# Patient Record
Sex: Male | Born: 1985 | Race: Black or African American | Hispanic: No | Smoking: Never smoker
Health system: Southern US, Community
[De-identification: ages and names within clinical notes are randomized; demographics above are authoritative.]

## PROBLEM LIST (undated history)

## (undated) DIAGNOSIS — K649 Unspecified hemorrhoids: Secondary | ICD-10-CM

## (undated) DIAGNOSIS — K219 Gastro-esophageal reflux disease without esophagitis: Secondary | ICD-10-CM

## (undated) DIAGNOSIS — E876 Hypokalemia: Secondary | ICD-10-CM

## (undated) HISTORY — PX: HERNIA REPAIR: SHX51

## (undated) HISTORY — DX: Gastro-esophageal reflux disease without esophagitis: K21.9

---

## 2008-07-27 ENCOUNTER — Emergency Department (HOSPITAL_COMMUNITY): Admission: EM | Admit: 2008-07-27 | Discharge: 2008-07-27 | Payer: Self-pay | Admitting: Emergency Medicine

## 2008-08-03 ENCOUNTER — Emergency Department (HOSPITAL_COMMUNITY): Admission: EM | Admit: 2008-08-03 | Discharge: 2008-08-03 | Payer: Self-pay | Admitting: Family Medicine

## 2009-01-23 ENCOUNTER — Emergency Department (HOSPITAL_COMMUNITY): Admission: EM | Admit: 2009-01-23 | Discharge: 2009-01-23 | Payer: Self-pay | Admitting: Emergency Medicine

## 2010-09-03 ENCOUNTER — Emergency Department (HOSPITAL_COMMUNITY)
Admission: EM | Admit: 2010-09-03 | Discharge: 2010-09-03 | Payer: Self-pay | Source: Home / Self Care | Admitting: Emergency Medicine

## 2010-11-06 ENCOUNTER — Emergency Department (HOSPITAL_COMMUNITY)
Admission: EM | Admit: 2010-11-06 | Discharge: 2010-11-06 | Payer: Self-pay | Source: Home / Self Care | Admitting: Emergency Medicine

## 2010-11-09 ENCOUNTER — Inpatient Hospital Stay (HOSPITAL_COMMUNITY)
Admission: EM | Admit: 2010-11-09 | Discharge: 2010-11-11 | Payer: Self-pay | Source: Home / Self Care | Attending: Internal Medicine | Admitting: Internal Medicine

## 2011-01-21 LAB — BASIC METABOLIC PANEL
BUN: 13 mg/dL (ref 6–23)
CO2: 27 mEq/L (ref 19–32)
Calcium: 9.6 mg/dL (ref 8.4–10.5)
Chloride: 103 mEq/L (ref 96–112)
Creatinine, Ser: 1.02 mg/dL (ref 0.4–1.5)
GFR calc Af Amer: >60 mL/min (ref >60)
GFR calc non Af Amer: >60 mL/min (ref >60)
Glucose, Bld: 130 mg/dL — ABNORMAL HIGH (ref 70–99)
Potassium: 4.1 mEq/L (ref 3.5–5.1)
Sodium: 139 mEq/L (ref 135–145)

## 2011-01-21 LAB — CBC
HCT: 42.9 % (ref 39.0–52.0)
HCT: 44.8 % (ref 39.0–52.0)
Hemoglobin: 14.4 g/dL (ref 13.0–17.0)
Hemoglobin: 15.1 g/dL (ref 13.0–17.0)
MCH: 30.6 pg (ref 26.0–34.0)
MCH: 30.8 pg (ref 26.0–34.0)
MCHC: 33.6 g/dL (ref 30.0–36.0)
MCHC: 33.7 g/dL (ref 30.0–36.0)
MCV: 91.1 fL (ref 78.0–100.0)
MCV: 91.2 fL (ref 78.0–100.0)
Platelets: 150 10*3/uL (ref 150–400)
Platelets: 172 10*3/uL (ref 150–400)
RBC: 4.71 MIL/uL (ref 4.22–5.81)
RBC: 4.91 MIL/uL (ref 4.22–5.81)
RDW: 12.8 % (ref 11.5–15.5)
RDW: 12.9 % (ref 11.5–15.5)
WBC: 6.2 10*3/uL (ref 4.0–10.5)
WBC: 6.2 10*3/uL (ref 4.0–10.5)

## 2011-01-21 LAB — COMPREHENSIVE METABOLIC PANEL
ALT: 17 U/L (ref 0–53)
AST: 23 U/L (ref 0–37)
Albumin: 3.8 g/dL (ref 3.5–5.2)
Alkaline Phosphatase: 58 U/L (ref 39–117)
BUN: 12 mg/dL (ref 6–23)
CO2: 26 mEq/L (ref 19–32)
Calcium: 9.4 mg/dL (ref 8.4–10.5)
Chloride: 103 mEq/L (ref 96–112)
Creatinine, Ser: 0.93 mg/dL (ref 0.4–1.5)
GFR calc Af Amer: >60 mL/min (ref >60)
GFR calc non Af Amer: >60 mL/min (ref >60)
Glucose, Bld: 129 mg/dL — ABNORMAL HIGH (ref 70–99)
Potassium: 3.9 mEq/L (ref 3.5–5.1)
Sodium: 138 mEq/L (ref 135–145)
Total Bilirubin: 0.6 mg/dL (ref 0.3–1.2)
Total Protein: 7.8 g/dL (ref 6.0–8.3)

## 2011-01-21 LAB — CULTURE, BLOOD (ROUTINE X 2)
Culture  Setup Time: 201112310430
Culture  Setup Time: 201112310430
Culture: NO GROWTH
Culture: NO GROWTH

## 2011-01-21 LAB — DIFFERENTIAL
Basophils Absolute: 0 10*3/uL (ref 0.0–0.1)
Basophils Relative: 0 % (ref 0–1)
Eosinophils Absolute: 0 10*3/uL (ref 0.0–0.7)
Eosinophils Relative: 0 % (ref 0–5)
Lymphocytes Relative: 15 % (ref 12–46)
Lymphs Abs: 0.9 10*3/uL (ref 0.7–4.0)
Monocytes Absolute: 0.2 10*3/uL (ref 0.1–1.0)
Monocytes Relative: 4 % (ref 3–12)
Neutro Abs: 5.1 10*3/uL (ref 1.7–7.7)
Neutrophils Relative %: 81 % — ABNORMAL HIGH (ref 43–77)

## 2011-01-21 LAB — D-DIMER, QUANTITATIVE: D-Dimer, Quant: <0.22 ug/mL-FEU (ref 0.00–0.48)

## 2011-01-23 LAB — COMPREHENSIVE METABOLIC PANEL
ALT: 20 U/L (ref 0–53)
AST: 23 U/L (ref 0–37)
Albumin: 3.9 g/dL (ref 3.5–5.2)
Alkaline Phosphatase: 57 U/L (ref 39–117)
BUN: 9 mg/dL (ref 6–23)
CO2: 32 mEq/L (ref 19–32)
Calcium: 9.2 mg/dL (ref 8.4–10.5)
Chloride: 103 mEq/L (ref 96–112)
Creatinine, Ser: 0.9 mg/dL (ref 0.4–1.5)
GFR calc Af Amer: >60 mL/min (ref >60)
GFR calc non Af Amer: >60 mL/min (ref >60)
Glucose, Bld: 112 mg/dL — ABNORMAL HIGH (ref 70–99)
Potassium: 4 mEq/L (ref 3.5–5.1)
Sodium: 139 mEq/L (ref 135–145)
Total Bilirubin: 0.6 mg/dL (ref 0.3–1.2)
Total Protein: 7.4 g/dL (ref 6.0–8.3)

## 2011-01-23 LAB — CBC
HCT: 43.8 % (ref 39.0–52.0)
Hemoglobin: 14.9 g/dL (ref 13.0–17.0)
MCH: 30.9 pg (ref 26.0–34.0)
MCHC: 34 g/dL (ref 30.0–36.0)
MCV: 90.8 fL (ref 78.0–100.0)
Platelets: 172 10*3/uL (ref 150–400)
RBC: 4.82 MIL/uL (ref 4.22–5.81)
RDW: 13.2 % (ref 11.5–15.5)
WBC: 6.5 10*3/uL (ref 4.0–10.5)

## 2011-01-23 LAB — SEDIMENTATION RATE: Sed Rate: 3 mm/hr (ref 0–16)

## 2011-01-23 LAB — HEMOCCULT GUIAC POC 1CARD (OFFICE): Fecal Occult Bld: POSITIVE

## 2011-02-21 LAB — DIFFERENTIAL
Basophils Absolute: 0 10*3/uL (ref 0.0–0.1)
Basophils Relative: 0 % (ref 0–1)
Eosinophils Absolute: 0.1 10*3/uL (ref 0.0–0.7)
Eosinophils Relative: 1 % (ref 0–5)
Lymphocytes Relative: 11 % — ABNORMAL LOW (ref 12–46)
Lymphs Abs: 0.7 10*3/uL (ref 0.7–4.0)
Monocytes Absolute: 1.1 10*3/uL — ABNORMAL HIGH (ref 0.1–1.0)
Monocytes Relative: 16 % — ABNORMAL HIGH (ref 3–12)
Neutro Abs: 4.7 10*3/uL (ref 1.7–7.7)
Neutrophils Relative %: 72 % (ref 43–77)

## 2011-02-21 LAB — URINALYSIS, ROUTINE W REFLEX MICROSCOPIC
Glucose, UA: NEGATIVE mg/dL
Hgb urine dipstick: NEGATIVE
Ketones, ur: >80 mg/dL — AB
Nitrite: NEGATIVE
Protein, ur: 30 mg/dL — AB
Specific Gravity, Urine: 1.038 — ABNORMAL HIGH (ref 1.005–1.030)
Urobilinogen, UA: 4 mg/dL — ABNORMAL HIGH (ref 0.0–1.0)
pH: 8 (ref 5.0–8.0)

## 2011-02-21 LAB — CBC
HCT: 46.3 % (ref 39.0–52.0)
Hemoglobin: 16 g/dL (ref 13.0–17.0)
MCHC: 34.6 g/dL (ref 30.0–36.0)
MCV: 89.5 fL (ref 78.0–100.0)
Platelets: 146 10*3/uL — ABNORMAL LOW (ref 150–400)
RBC: 5.17 MIL/uL (ref 4.22–5.81)
RDW: 13.6 % (ref 11.5–15.5)
WBC: 6.6 10*3/uL (ref 4.0–10.5)

## 2011-02-21 LAB — MAGNESIUM: Magnesium: 2 mg/dL (ref 1.5–2.5)

## 2011-02-21 LAB — LIPASE, BLOOD: Lipase: 17 U/L (ref 11–59)

## 2011-02-21 LAB — COMPREHENSIVE METABOLIC PANEL
ALT: 24 U/L (ref 0–53)
AST: 44 U/L — ABNORMAL HIGH (ref 0–37)
Albumin: 4.4 g/dL (ref 3.5–5.2)
Alkaline Phosphatase: 75 U/L (ref 39–117)
BUN: 10 mg/dL (ref 6–23)
CO2: 24 mEq/L (ref 19–32)
Calcium: 9.7 mg/dL (ref 8.4–10.5)
Chloride: 99 mEq/L (ref 96–112)
Creatinine, Ser: 1.1 mg/dL (ref 0.4–1.5)
GFR calc Af Amer: >60 mL/min (ref >60)
GFR calc non Af Amer: >60 mL/min (ref >60)
Glucose, Bld: 97 mg/dL (ref 70–99)
Potassium: 3.3 mEq/L — ABNORMAL LOW (ref 3.5–5.1)
Sodium: 136 mEq/L (ref 135–145)
Total Bilirubin: 1.6 mg/dL — ABNORMAL HIGH (ref 0.3–1.2)
Total Protein: 8.1 g/dL (ref 6.0–8.3)

## 2011-02-21 LAB — POCT I-STAT, CHEM 8
BUN: 10 mg/dL (ref 6–23)
Calcium, Ion: 1.06 mmol/L — ABNORMAL LOW (ref 1.12–1.32)
Chloride: 102 mEq/L (ref 96–112)
Creatinine, Ser: 1.3 mg/dL (ref 0.4–1.5)
Glucose, Bld: 95 mg/dL (ref 70–99)
HCT: 49 % (ref 39.0–52.0)
Hemoglobin: 16.7 g/dL (ref 13.0–17.0)
Potassium: 3.4 mEq/L — ABNORMAL LOW (ref 3.5–5.1)
Sodium: 137 mEq/L (ref 135–145)
TCO2: 26 mmol/L (ref 0–100)

## 2011-02-21 LAB — URINE MICROSCOPIC-ADD ON

## 2011-08-12 LAB — POCT I-STAT, CHEM 8
BUN: 10
Chloride: 102
Creatinine, Ser: 1.2
Glucose, Bld: 94
Hemoglobin: 16
Potassium: 3.3 — ABNORMAL LOW
Sodium: 139

## 2011-08-12 LAB — DIFFERENTIAL
Eosinophils Absolute: 0
Lymphocytes Relative: 4 — ABNORMAL LOW
Lymphs Abs: 0.4 — ABNORMAL LOW
Monocytes Relative: 7
Neutrophils Relative %: 89 — ABNORMAL HIGH

## 2011-08-12 LAB — CBC
MCV: 91.3
Platelets: 147 — ABNORMAL LOW
RBC: 4.8
WBC: 8.5

## 2013-01-11 ENCOUNTER — Emergency Department (HOSPITAL_COMMUNITY)
Admission: EM | Admit: 2013-01-11 | Discharge: 2013-01-11 | Disposition: A | Discharge status: Home / Self Care | Payer: BC Managed Care – PPO | Attending: Emergency Medicine | Admitting: Emergency Medicine

## 2013-01-11 ENCOUNTER — Encounter (HOSPITAL_COMMUNITY): Payer: Self-pay | Admitting: *Deleted

## 2013-01-11 DIAGNOSIS — R112 Nausea with vomiting, unspecified: Secondary | ICD-10-CM | POA: Insufficient documentation

## 2013-01-11 DIAGNOSIS — R197 Diarrhea, unspecified: Secondary | ICD-10-CM | POA: Insufficient documentation

## 2013-01-11 DIAGNOSIS — R6883 Chills (without fever): Secondary | ICD-10-CM | POA: Insufficient documentation

## 2013-01-11 LAB — CBC WITH DIFFERENTIAL/PLATELET
Basophils Relative: 0 % (ref 0–1)
Eosinophils Absolute: 0.2 10*3/uL (ref 0.0–0.7)
Eosinophils Relative: 1 % (ref 0–5)
HCT: 49 % (ref 39.0–52.0)
Hemoglobin: 17 g/dL (ref 13.0–17.0)
Lymphs Abs: 1.2 10*3/uL (ref 0.7–4.0)
MCH: 30.9 pg (ref 26.0–34.0)
MCHC: 34.7 g/dL (ref 30.0–36.0)
MCV: 88.9 fL (ref 78.0–100.0)
Monocytes Absolute: 0.9 10*3/uL (ref 0.1–1.0)
Monocytes Relative: 7 % (ref 3–12)
Neutrophils Relative %: 83 % — ABNORMAL HIGH (ref 43–77)
RBC: 5.51 MIL/uL (ref 4.22–5.81)

## 2013-01-11 LAB — BASIC METABOLIC PANEL
BUN: 17 mg/dL (ref 6–23)
Creatinine, Ser: 1.03 mg/dL (ref 0.50–1.35)
GFR calc Af Amer: >90 mL/min (ref >90)
GFR calc non Af Amer: >90 mL/min (ref >90)
Glucose, Bld: 146 mg/dL — ABNORMAL HIGH (ref 70–99)
Potassium: 3.5 mEq/L (ref 3.5–5.1)

## 2013-01-11 LAB — HEPATIC FUNCTION PANEL
AST: 35 U/L (ref 0–37)
Albumin: 4.9 g/dL (ref 3.5–5.2)
Alkaline Phosphatase: 76 U/L (ref 39–117)
Bilirubin, Direct: 0.1 mg/dL (ref 0.0–0.3)
Total Bilirubin: 0.8 mg/dL (ref 0.3–1.2)

## 2013-01-11 MED ORDER — HYDROCODONE-ACETAMINOPHEN 5-325 MG PO TABS: 1.0000 | ORAL_TABLET | ORAL | Status: DC | PRN

## 2013-01-11 MED ORDER — ONDANSETRON 8 MG PO TBDP: 8.0000 mg | ORAL_TABLET | Freq: Three times a day (TID) | ORAL | Status: DC | PRN

## 2013-01-11 MED ORDER — MORPHINE SULFATE 4 MG/ML IJ SOLN
6.0000 mg | Freq: Once | INTRAMUSCULAR | Status: AC
  Administered 2013-01-11: 4 mg via INTRAVENOUS
  Filled 2013-01-11: qty 1

## 2013-01-11 MED ORDER — SODIUM CHLORIDE 0.9 % IV BOLUS (SEPSIS)
1000.0000 mL | Freq: Once | INTRAVENOUS | Status: AC
  Administered 2013-01-11: 1000 mL via INTRAVENOUS

## 2013-01-11 MED ORDER — ONDANSETRON 8 MG PO TBDP
8.0000 mg | ORAL_TABLET | Freq: Once | ORAL | Status: AC
  Administered 2013-01-11: 8 mg via ORAL
  Filled 2013-01-11: qty 1

## 2013-01-11 MED ORDER — IBUPROFEN 200 MG PO TABS
600.0000 mg | ORAL_TABLET | Freq: Once | ORAL | Status: AC
  Administered 2013-01-11: 600 mg via ORAL
  Filled 2013-01-11: qty 3

## 2013-01-11 MED ORDER — ONDANSETRON HCL 4 MG/2ML IJ SOLN
4.0000 mg | Freq: Once | INTRAMUSCULAR | Status: AC
  Administered 2013-01-11: 4 mg via INTRAVENOUS
  Filled 2013-01-11: qty 2

## 2013-01-11 NOTE — ED Notes (Signed)
Pt reports acute onset of n/v/d that began this a.m. - pt unsure of fever however c/o some chills.

## 2013-01-11 NOTE — ED Provider Notes (Signed)
History     CSN: [id number]  Arrival date & time 01/11/13  1904   First MD Initiated Contact with Patient 01/11/13 2024      Chief Complaint  Patient presents with  . Nausea  . Emesis  . Diarrhea    The history is provided by the patient.   patient reports developing nausea vomiting and diarrhea that began this morning.  His had chills without documented fever.  He reports mild upper abdominal pain.  No melena or hematochezia.  No hematemesis.  He's had decreased oral intake today and states he feels slightly weak.  No lower abdominal pain.  No urinary symptoms.  No other complaints.  Nothing worsens or improves his pain worsened.  The symptoms are mild to moderate in severity  History reviewed. No pertinent past medical history.  History reviewed. No pertinent past surgical history.  No family history on file.  History  Substance Use Topics  . Smoking status: Never Smoker   . Smokeless tobacco: Not on file  . Alcohol Use: No      Review of Systems  Gastrointestinal: Positive for vomiting and diarrhea.  All other systems reviewed and are negative.    Allergies  Review of patient's allergies indicates no known allergies.  Home Medications   Current Outpatient Rx  Name  Route  Sig  Dispense  Refill  . HYDROcodone-acetaminophen (NORCO/VICODIN) 5-325 MG per tablet   Oral   Take 1 tablet by mouth every 4 (four) hours as needed for pain.   8 tablet   0   . ondansetron (ZOFRAN ODT) 8 MG disintegrating tablet   Oral   Take 1 tablet (8 mg total) by mouth every 8 (eight) hours as needed for nausea.   10 tablet   0     BP 124/86  Pulse 81  Temp(Src) 97.4 F (36.3 C) (Oral)  Resp 22  SpO2 99%  Physical Exam  Nursing note and vitals reviewed. Constitutional: He is oriented to person, place, and time. He appears well-developed and well-nourished.  HENT:  Head: Normocephalic and atraumatic.  Eyes: EOM are normal.  Neck: Normal range of motion.   Cardiovascular: Normal rate, regular rhythm, normal heart sounds and intact distal pulses.   Pulmonary/Chest: Effort normal and breath sounds normal. No respiratory distress.  Abdominal: Soft. He exhibits no distension. There is no tenderness.  Musculoskeletal: Normal range of motion.  Neurological: He is alert and oriented to person, place, and time.  Skin: Skin is warm and dry.  Psychiatric: He has a normal mood and affect. Judgment normal.    ED Course  Procedures (including critical care time)  Labs Reviewed  CBC WITH DIFFERENTIAL - Abnormal; Notable for the following:    WBC 13.8 (*)    Neutrophils Relative 83 (*)    Neutro Abs 11.4 (*)    Lymphocytes Relative 8 (*)    All other components within normal limits  BASIC METABOLIC PANEL - Abnormal; Notable for the following:    Sodium 133 (*)    Chloride 95 (*)    Glucose, Bld 146 (*)    All other components within normal limits  HEPATIC FUNCTION PANEL - Abnormal; Notable for the following:    Total Protein 8.8 (*)    All other components within normal limits  LIPASE, BLOOD   No results found.   1. Nausea vomiting and diarrhea       MDM  Abdominal pain.  The patient thinks he may have had an appendectomy.  His abdomen is not tender on exam at this time to warrant CT scan.  Patient has been asked to return the ER in 24 hours for repeat abdominal exam.  Her blood cell count noted.  Vital signs normal.  Afebrile.  Likely viral nausea vomiting or diarrhea, however I told the patient is a limited come back tomorrow for repeat exam.        Lyanne Co, MD 01/11/13 2150

## 2014-12-19 ENCOUNTER — Encounter (HOSPITAL_COMMUNITY): Payer: Self-pay

## 2014-12-19 ENCOUNTER — Emergency Department (HOSPITAL_COMMUNITY)
Admission: EM | Admit: 2014-12-19 | Discharge: 2014-12-20 | Disposition: A | Discharge status: Home / Self Care | Payer: Self-pay | Attending: Emergency Medicine | Admitting: Emergency Medicine

## 2014-12-19 DIAGNOSIS — R109 Unspecified abdominal pain: Secondary | ICD-10-CM

## 2014-12-19 DIAGNOSIS — R1110 Vomiting, unspecified: Secondary | ICD-10-CM | POA: Insufficient documentation

## 2014-12-19 DIAGNOSIS — R1084 Generalized abdominal pain: Secondary | ICD-10-CM | POA: Insufficient documentation

## 2014-12-19 NOTE — ED Notes (Signed)
Pt complains of abd cramping, hand cramping and facial numbness progressively over the last few hours.

## 2014-12-20 LAB — CBC WITH DIFFERENTIAL/PLATELET
BASOS PCT: 0 % (ref 0–1)
Basophils Absolute: 0 10*3/uL (ref 0.0–0.1)
EOS PCT: 2 % (ref 0–5)
Eosinophils Absolute: 0.2 10*3/uL (ref 0.0–0.7)
HEMATOCRIT: 43.4 % (ref 39.0–52.0)
Hemoglobin: 15.1 g/dL (ref 13.0–17.0)
LYMPHS ABS: 1.6 10*3/uL (ref 0.7–4.0)
LYMPHS PCT: 11 % — AB (ref 12–46)
MCH: 30.7 pg (ref 26.0–34.0)
MCHC: 34.8 g/dL (ref 30.0–36.0)
MCV: 88.2 fL (ref 78.0–100.0)
MONO ABS: 1.2 10*3/uL — AB (ref 0.1–1.0)
Monocytes Relative: 8 % (ref 3–12)
Neutro Abs: 11.3 10*3/uL — ABNORMAL HIGH (ref 1.7–7.7)
Neutrophils Relative %: 79 % — ABNORMAL HIGH (ref 43–77)
PLATELETS: 182 10*3/uL (ref 150–400)
RBC: 4.92 MIL/uL (ref 4.22–5.81)
RDW: 12.8 % (ref 11.5–15.5)
WBC: 14.4 10*3/uL — ABNORMAL HIGH (ref 4.0–10.5)

## 2014-12-20 LAB — URINALYSIS, ROUTINE W REFLEX MICROSCOPIC
Bilirubin Urine: NEGATIVE
GLUCOSE, UA: NEGATIVE mg/dL
Hgb urine dipstick: NEGATIVE
KETONES UR: NEGATIVE mg/dL
LEUKOCYTES UA: NEGATIVE
Nitrite: NEGATIVE
PH: 8.5 — AB (ref 5.0–8.0)
Protein, ur: NEGATIVE mg/dL
Specific Gravity, Urine: 1.019 (ref 1.005–1.030)
Urobilinogen, UA: 1 mg/dL (ref 0.0–1.0)

## 2014-12-20 LAB — AMYLASE: AMYLASE: 32 U/L (ref 0–105)

## 2014-12-20 LAB — BASIC METABOLIC PANEL
Anion gap: 11 (ref 5–15)
BUN: 12 mg/dL (ref 6–23)
CHLORIDE: 101 mmol/L (ref 96–112)
CO2: 24 mmol/L (ref 19–32)
CREATININE: 1.03 mg/dL (ref 0.50–1.35)
Calcium: 9.8 mg/dL (ref 8.4–10.5)
GFR calc Af Amer: >90 mL/min (ref >90)
GFR calc non Af Amer: >90 mL/min (ref >90)
Glucose, Bld: 114 mg/dL — ABNORMAL HIGH (ref 70–99)
Potassium: 3.1 mmol/L — ABNORMAL LOW (ref 3.5–5.1)
Sodium: 136 mmol/L (ref 135–145)

## 2014-12-20 LAB — LIPASE, BLOOD: LIPASE: 17 U/L (ref 11–59)

## 2014-12-20 MED ORDER — ONDANSETRON 4 MG PO TBDP
4.0000 mg | ORAL_TABLET | Freq: Once | ORAL | Status: AC
  Administered 2014-12-20: 4 mg via ORAL
  Filled 2014-12-20: qty 1

## 2014-12-20 MED ORDER — POTASSIUM CHLORIDE CRYS ER 20 MEQ PO TBCR
40.0000 meq | EXTENDED_RELEASE_TABLET | Freq: Once | ORAL | Status: AC
  Administered 2014-12-20: 40 meq via ORAL
  Filled 2014-12-20: qty 2

## 2014-12-20 MED ORDER — DICYCLOMINE HCL 20 MG PO TABS
10.0000 mg | ORAL_TABLET | Freq: Once | ORAL | Status: AC
  Administered 2014-12-20: 10 mg via ORAL
  Filled 2014-12-20: qty 1

## 2014-12-20 NOTE — ED Notes (Signed)
Pt resting quietly on his right side with his eyes closed. Appears in no distress. Respirations are even, regular, and unlabored.

## 2014-12-20 NOTE — Discharge Instructions (Signed)

## 2014-12-30 NOTE — ED Provider Notes (Signed)
CSN: [id number]     Arrival date & time 12/19/14  2330 History   First MD Initiated Contact with Patient 12/20/14 0208     Chief Complaint  Patient presents with  . Abdominal Cramping     (Consider location/radiation/quality/duration/timing/severity/associated sxs/prior Treatment) HPI   29 year old male with abdominal pain. Describes diffuse cramping. No appreciable exacerbating relieving factors. Symptom onset earlier this evening. No urinary complaints. No diarrhea. Mild nausea, but no vomiting. She associated with cramping sensation in his hands and some mild numbness to his face. No respiratory complaints. Does not feel particularly anxious. No sick contacts.  History reviewed. No pertinent past medical history. History reviewed. No pertinent past surgical history. History reviewed. No pertinent family history. History  Substance Use Topics  . Smoking status: Never Smoker   . Smokeless tobacco: Not on file  . Alcohol Use: No    Review of Systems  All systems reviewed and negative, other than as noted in HPI.   Allergies  Review of patient's allergies indicates no known allergies.  Home Medications   Prior to Admission medications   Medication Sig Start Date End Date Taking? Authorizing Provider  diphenhydrAMINE (BENADRYL) 25 mg capsule Take 50 mg by mouth once.   Yes Historical Provider, MD  HYDROcodone-acetaminophen (NORCO/VICODIN) 5-325 MG per tablet Take 1 tablet by mouth every 4 (four) hours as needed for pain. Patient not taking: Reported on 12/19/2014 01/11/13   Lyanne CoKevin M Campos, MD  ondansetron (ZOFRAN ODT) 8 MG disintegrating tablet Take 1 tablet (8 mg total) by mouth every 8 (eight) hours as needed for nausea. Patient not taking: Reported on 12/19/2014 01/11/13   Lyanne CoKevin M Campos, MD   BP 139/81 mmHg  Pulse 64  Temp(Src) 99.3 F (37.4 C) (Oral)  Resp 20  SpO2 98% Physical Exam  Constitutional: He appears well-developed and well-nourished. No distress.  HENT:  Head:  Normocephalic and atraumatic.  Eyes: Conjunctivae are normal. Right eye exhibits no discharge. Left eye exhibits no discharge.  Neck: Neck supple.  Cardiovascular: Normal rate, regular rhythm and normal heart sounds.  Exam reveals no gallop and no friction rub.   No murmur heard. Pulmonary/Chest: Effort normal and breath sounds normal. No respiratory distress.  Abdominal: Soft. He exhibits no distension. There is no tenderness.  Musculoskeletal: He exhibits no edema or tenderness.  Neurological: He is alert.  Skin: Skin is warm and dry.  Psychiatric: He has a normal mood and affect. His behavior is normal. Thought content normal.  Nursing note and vitals reviewed.   ED Course  Procedures (including critical care time) Labs Review Labs Reviewed  BASIC METABOLIC PANEL - Abnormal; Notable for the following:    Potassium 3.1 (*)    Glucose, Bld 114 (*)    All other components within normal limits  CBC WITH DIFFERENTIAL/PLATELET - Abnormal; Notable for the following:    WBC 14.4 (*)    Neutrophils Relative % 79 (*)    Neutro Abs 11.3 (*)    Lymphocytes Relative 11 (*)    Monocytes Absolute 1.2 (*)    All other components within normal limits  URINALYSIS, ROUTINE W REFLEX MICROSCOPIC - Abnormal; Notable for the following:    APPearance CLOUDY (*)    pH 8.5 (*)    All other components within normal limits  AMYLASE  LIPASE, BLOOD    Imaging Review No results found.   EKG Interpretation None      MDM   Final diagnoses:  Abdominal cramping    29 year old  male with abdominal cramping. Benign abdominal exam. Afebrile. Hemodynamically stable. Workup pre-unremarkable aside from mild hypokalemia. He does have a leukocytosis, but this is nonspecific. Low suspicion for serious bacterial illness or emergent surgical process. At this point I feel he is appropriate for discharge. Symptomatically treatment. Return precautions were discussed.    Raeford Razor, MD 12/30/14 [phone]

## 2015-06-28 ENCOUNTER — Emergency Department (HOSPITAL_COMMUNITY)
Admission: EM | Admit: 2015-06-28 | Discharge: 2015-06-28 | Disposition: A | Discharge status: Home / Self Care | Payer: BLUE CROSS/BLUE SHIELD | Source: Home / Self Care | Attending: Family Medicine | Admitting: Family Medicine

## 2015-06-28 ENCOUNTER — Encounter (HOSPITAL_COMMUNITY): Payer: Self-pay | Admitting: *Deleted

## 2015-06-28 ENCOUNTER — Encounter (HOSPITAL_COMMUNITY): Payer: Self-pay | Admitting: Emergency Medicine

## 2015-06-28 ENCOUNTER — Emergency Department (HOSPITAL_COMMUNITY)
Admission: EM | Admit: 2015-06-28 | Discharge: 2015-06-29 | Disposition: A | Discharge status: Home / Self Care | Payer: BLUE CROSS/BLUE SHIELD | Attending: Emergency Medicine | Admitting: Emergency Medicine

## 2015-06-28 ENCOUNTER — Emergency Department (INDEPENDENT_AMBULATORY_CARE_PROVIDER_SITE_OTHER)
Admission: EM | Admit: 2015-06-28 | Discharge: 2015-06-28 | Disposition: A | Discharge status: Other Acute Inpatient Hospital | Payer: BLUE CROSS/BLUE SHIELD | Source: Home / Self Care | Attending: Family Medicine | Admitting: Family Medicine

## 2015-06-28 DIAGNOSIS — K645 Perianal venous thrombosis: Secondary | ICD-10-CM

## 2015-06-28 DIAGNOSIS — K623 Rectal prolapse: Secondary | ICD-10-CM | POA: Diagnosis not present

## 2015-06-28 DIAGNOSIS — K648 Other hemorrhoids: Secondary | ICD-10-CM | POA: Insufficient documentation

## 2015-06-28 DIAGNOSIS — K625 Hemorrhage of anus and rectum: Secondary | ICD-10-CM | POA: Diagnosis present

## 2015-06-28 HISTORY — DX: Unspecified hemorrhoids: K64.9

## 2015-06-28 LAB — TYPE AND SCREEN
ABO/RH(D): A POS
ANTIBODY SCREEN: NEGATIVE
PT AG Type: NEGATIVE

## 2015-06-28 LAB — COMPREHENSIVE METABOLIC PANEL
ALT: 43 U/L (ref 17–63)
AST: 36 U/L (ref 15–41)
Albumin: 4.3 g/dL (ref 3.5–5.0)
Alkaline Phosphatase: 71 U/L (ref 38–126)
Anion gap: 12 (ref 5–15)
BILIRUBIN TOTAL: 0.8 mg/dL (ref 0.3–1.2)
BUN: 12 mg/dL (ref 6–20)
CO2: 23 mmol/L (ref 22–32)
Calcium: 10 mg/dL (ref 8.9–10.3)
Chloride: 103 mmol/L (ref 101–111)
Creatinine, Ser: 1.11 mg/dL (ref 0.61–1.24)
GFR calc Af Amer: >60 mL/min (ref >60)
Glucose, Bld: 115 mg/dL — ABNORMAL HIGH (ref 65–99)
POTASSIUM: 3.9 mmol/L (ref 3.5–5.1)
Sodium: 138 mmol/L (ref 135–145)
TOTAL PROTEIN: 8.1 g/dL (ref 6.5–8.1)

## 2015-06-28 LAB — CBC
HEMATOCRIT: 43.1 % (ref 39.0–52.0)
Hemoglobin: 15.1 g/dL (ref 13.0–17.0)
MCH: 30.6 pg (ref 26.0–34.0)
MCHC: 35 g/dL (ref 30.0–36.0)
MCV: 87.2 fL (ref 78.0–100.0)
PLATELETS: 188 10*3/uL (ref 150–400)
RBC: 4.94 MIL/uL (ref 4.22–5.81)
RDW: 12.8 % (ref 11.5–15.5)
WBC: 10.6 10*3/uL — AB (ref 4.0–10.5)

## 2015-06-28 MED ORDER — ONDANSETRON 4 MG PO TBDP
ORAL_TABLET | ORAL | Status: AC
  Filled 2015-06-28: qty 1

## 2015-06-28 MED ORDER — ONDANSETRON 4 MG PO TBDP
4.0000 mg | ORAL_TABLET | Freq: Once | ORAL | Status: AC
  Administered 2015-06-28: 4 mg via ORAL

## 2015-06-28 MED ORDER — HYDROMORPHONE HCL 1 MG/ML IJ SOLN
INTRAMUSCULAR | Status: AC
  Filled 2015-06-28: qty 2

## 2015-06-28 MED ORDER — HYDROMORPHONE HCL 1 MG/ML IJ SOLN
2.0000 mg | Freq: Once | INTRAMUSCULAR | Status: AC
  Administered 2015-06-28: 2 mg via INTRAMUSCULAR

## 2015-06-28 MED ORDER — HYDROMORPHONE HCL 1 MG/ML IJ SOLN
2.0000 mg | Freq: Once | INTRAMUSCULAR | Status: DC
  Filled 2015-06-28 (×2): qty 2

## 2015-06-28 MED ORDER — HYDROMORPHONE HCL 1 MG/ML IJ SOLN
1.0000 mg | Freq: Once | INTRAMUSCULAR | Status: AC
  Administered 2015-06-28: 1 mg via INTRAVENOUS
  Filled 2015-06-28: qty 1

## 2015-06-28 NOTE — ED Provider Notes (Signed)
CSN: [id number]     Arrival date & time 06/28/15  1935 History   First MD Initiated Contact with Patient 06/28/15 1937     Chief Complaint  Patient presents with  . Rectal Bleeding   (Consider location/radiation/quality/duration/timing/severity/associated sxs/prior Treatment) Patient is a 29 y.o. male presenting with hematochezia. The history is provided by the patient.  Rectal Bleeding Quality:  Bright red Amount:  Moderate Duration:  5 hours Timing:  Intermittent Progression:  Unchanged Chronicity:  New Context: hemorrhoids   Context comment:  Pt seen [facility] at [facility] earlier today with simple incision of thrombosed hemorrhoid, states pain and bleeding and swelling have gotten worse. in spite of sitz bath soaks. Ineffective treatments:  Sitz baths   No past medical history on file. No past surgical history on file. No family history on file. Social History  Substance Use Topics  . Smoking status: Never Smoker   . Smokeless tobacco: Not on file  . Alcohol Use: No    Review of Systems  Constitutional: Negative.   Gastrointestinal: Positive for hematochezia, anal bleeding and rectal pain.    Allergies  Review of patient's allergies indicates no known allergies.  Home Medications   Prior to Admission medications   Medication Sig Start Date End Date Taking? Authorizing Provider  diphenhydrAMINE (BENADRYL) 25 mg capsule Take 50 mg by mouth once.    Historical Provider, MD  HYDROcodone-acetaminophen (NORCO/VICODIN) 5-325 MG per tablet Take 1 tablet by mouth every 4 (four) hours as needed for pain. Patient not taking: Reported on 12/19/2014 01/11/13   Azalia Bilis, MD  ondansetron [facility] ODT) 8 MG disintegrating tablet Take 1 tablet (8 mg total) by mouth every 8 (eight) hours as needed for nausea. Patient not taking: Reported on 12/19/2014 01/11/13   Azalia Bilis, MD   There were no vitals taken for this visit. Physical Exam  Constitutional: He appears well-developed and  well-nourished. He appears distressed.  Abdominal: Soft. Bowel sounds are normal.  Genitourinary:  Rectal prolapse has developed since earlier simple procedure with tender hemorrhoidal sts and possible thrombosis to a much more signif degree than prev, will send to ER for surgical eval.  Skin: Skin is warm and dry.  Nursing note and vitals reviewed.   ED Course  Procedures (including critical care time) Labs Review Labs Reviewed - No data to display  Imaging Review No results found.   MDM   1. Rectal mucosa prolapse    Sent for surgical eval of rectal prolapse following simple i+d of hemorrhoid earlier today which was grape sized only.    Linna Hoff, MD 06/28/15 [phone]

## 2015-06-28 NOTE — ED Notes (Signed)
npo

## 2015-06-28 NOTE — ED Notes (Addendum)
Pt  Reports  Rectal  Bleeding  And  Pain     - pt  Was   Seen  Earlier  Today  And  Had  A thrombosed  hemmoriod   Incised by  Dr   Artis Flock   -  Pt  Reports  Increase  In  Rectal bleeding  And pain      Since  He  Left    the  Pt  Has   A   Large  External     Prolapsed       Area        In  Rectal  area

## 2015-06-28 NOTE — ED Notes (Signed)
Pt from [facility].  Seen earlier today and had a hemorrhoid incised by Dr. Artis Flock.  Pt returned tonight due to increased pain and large amount of bleeding.  [facility] reports pt has large prolapsed area in rectum and sent pt to ED for further eval.  Pt given pain meds at [facility] and still reports 10/10 pain.

## 2015-06-28 NOTE — ED Provider Notes (Signed)
CSN: [id number]     Arrival date & time 06/28/15  1305 History   First MD Initiated Contact with Patient 06/28/15 1406     Chief Complaint  Patient presents with  . Rectal Problems   (Consider location/radiation/quality/duration/timing/severity/associated sxs/prior Treatment) Patient is a 29 y.o. male presenting with hematochezia. The history is provided by the patient.  Rectal Bleeding Quality:  Bright red Amount:  Scant Duration:  3 weeks Progression:  Worsening Chronicity:  New Context: defecation and hemorrhoids   Context: not constipation   Relieved by:  Nothing Worsened by:  Nothing tried Ineffective treatments:  None tried   History reviewed. No pertinent past medical history. History reviewed. No pertinent past surgical history. History reviewed. No pertinent family history. Social History  Substance Use Topics  . Smoking status: Never Smoker   . Smokeless tobacco: None  . Alcohol Use: No    Review of Systems  Constitutional: Negative.   Gastrointestinal: Positive for hematochezia, anal bleeding and rectal pain.    Allergies  Review of patient's allergies indicates no known allergies.  Home Medications   Prior to Admission medications   Medication Sig Start Date End Date Taking? Authorizing Provider  diphenhydrAMINE (BENADRYL) 25 mg capsule Take 50 mg by mouth once.    Historical Provider, MD  HYDROcodone-acetaminophen (NORCO/VICODIN) 5-325 MG per tablet Take 1 tablet by mouth every 4 (four) hours as needed for pain. Patient not taking: Reported on 12/19/2014 01/11/13   Azalia Bilis, MD  ondansetron [facility] ODT) 8 MG disintegrating tablet Take 1 tablet (8 mg total) by mouth every 8 (eight) hours as needed for nausea. Patient not taking: Reported on 12/19/2014 01/11/13   Azalia Bilis, MD   BP 141/98 mmHg  Pulse 55  Temp(Src) 98.6 F (37 C) (Oral)  Resp 16  SpO2 99% Physical Exam  Constitutional: He appears well-developed and well-nourished. No distress.   Genitourinary: Rectal exam shows external hemorrhoid and tenderness. Rectal exam shows no fissure.  Skin: Skin is warm and dry.  Nursing note and vitals reviewed.   ED Course  INCISION AND DRAINAGE Date/Time: 06/28/2015 2:30 PM Performed by: Linna Hoff Authorized by: Bradd Canary D Consent: Verbal consent obtained. Risks and benefits: risks, benefits and alternatives were discussed Consent given by: patient Type: hematoma Body area: anogenital Location details: perianal Local anesthetic: topical anesthetic Patient sedated: no Scalpel size: 11 Incision type: elliptical Complexity: simple Drainage: bloody Drainage amount: moderate Patient tolerance: Patient tolerated the procedure well with no immediate complications Comments: Mult clots removed.   (including critical care time) Labs Review Labs Reviewed - No data to display  Imaging Review No results found.   MDM   1. Thrombosed external hemorrhoid        Linna Hoff, MD 06/28/15 1434

## 2015-06-28 NOTE — ED Notes (Signed)
Pt reports  Symptoms  Of  Rectal  Problems         Noticed    Protrusion       denys  Any  Bleeding         Pt  Reports  Some  Nausea   No  Vomiting        Pt reports     No history   Of  Any  Similar  Symptoms

## 2015-06-29 MED ORDER — DOCUSATE SODIUM 100 MG PO CAPS: 100.0000 mg | ORAL_CAPSULE | Freq: Two times a day (BID) | ORAL | Status: DC

## 2015-06-29 MED ORDER — HYDROCORTISONE ACE-PRAMOXINE 1-1 % RE FOAM: 1.0000 | Freq: Two times a day (BID) | RECTAL | Status: DC

## 2015-06-29 MED ORDER — HYDROMORPHONE HCL 1 MG/ML IJ SOLN
2.0000 mg | Freq: Once | INTRAMUSCULAR | Status: AC
  Administered 2015-06-29: 2 mg via INTRAMUSCULAR

## 2015-06-29 MED ORDER — LIDOCAINE (ANORECTAL) 5 % EX CREA: 1.0000 "application " | TOPICAL_CREAM | Freq: Three times a day (TID) | CUTANEOUS | Status: DC

## 2015-06-29 MED ORDER — OXYCODONE-ACETAMINOPHEN 5-325 MG PO TABS: 1.0000 | ORAL_TABLET | Freq: Four times a day (QID) | ORAL | Status: DC | PRN

## 2015-06-29 NOTE — ED Provider Notes (Signed)
CSN: [id number]     Arrival date & time 06/28/15  2016 History   First MD Initiated Contact with Patient 06/28/15 2120     Chief Complaint  Patient presents with  . Hemorrhoids  . Rectal Bleeding     (Consider location/radiation/quality/duration/timing/severity/associated sxs/prior Treatment) HPI Patient presents to the emergency department with rectal pain.  The patient states he was seen in urgent care and had a thrombosed hemorrhoid incised and states that several hours later developed increasing pain in swelling to the rectal area.  Patient states that the area was bleeding from the hemorrhoid that was excised.  The patient states that he did try soaking in a warm tub.  Patient denies nausea, vomiting, weakness, dizziness, headache, blurred vision, shortness of breath, chest pain, dizziness, lightheadedness or syncope.  Patient states that he did not take any medications prior to arrival Past Medical History  Diagnosis Date  . Hemorrhoid    History reviewed. No pertinent past surgical history. No family history on file. Social History  Substance Use Topics  . Smoking status: Never Smoker   . Smokeless tobacco: None  . Alcohol Use: No    Review of Systems All other systems negative except as documented in the HPI. All pertinent positives and negatives as reviewed in the HPI.   Allergies  Review of patient's allergies indicates no known allergies.  Home Medications   Prior to Admission medications   Medication Sig Start Date End Date Taking? Authorizing Provider  HYDROcodone-acetaminophen (NORCO/VICODIN) 5-325 MG per tablet Take 1 tablet by mouth every 4 (four) hours as needed for pain. Patient not taking: Reported on 12/19/2014 01/11/13   Azalia Bilis, MD  ondansetron [facility] ODT) 8 MG disintegrating tablet Take 1 tablet (8 mg total) by mouth every 8 (eight) hours as needed for nausea. Patient not taking: Reported on 12/19/2014 01/11/13   Azalia Bilis, MD   BP 130/86 mmHg  Pulse  51  Temp(Src) 98.2 F (36.8 C) (Oral)  Resp 22  SpO2 100% Physical Exam  Constitutional: He is oriented to person, place, and time. He appears well-developed and well-nourished. No distress.  HENT:  Head: Normocephalic and atraumatic.  Mouth/Throat: Oropharynx is clear and moist.  Eyes: Pupils are equal, round, and reactive to light.  Neck: Normal range of motion.  Cardiovascular: Normal rate, regular rhythm and normal heart sounds.  Exam reveals no gallop and no friction rub.   No murmur heard. Pulmonary/Chest: Effort normal and breath sounds normal.  Genitourinary:     Neurological: He is alert and oriented to person, place, and time. He exhibits normal muscle tone. Coordination normal.  Skin: Skin is warm and dry. No rash noted. No erythema.  Nursing note and vitals reviewed.   ED Course  Procedures (including critical care time) Labs Review Labs Reviewed  COMPREHENSIVE METABOLIC PANEL - Abnormal; Notable for the following:    Glucose, Bld 115 (*)    All other components within normal limits  CBC - Abnormal; Notable for the following:    WBC 10.6 (*)    All other components within normal limits  TYPE AND SCREEN    Imaging Review No results found. I have personally reviewed and evaluated these images and lab results as part of my medical decision-making.  I spoke with Dr. Dwain Sarna of General surgery and described the patient's findings and condition, he felt the patient could be discharged home with pain control, stool softeners and follow-up in his office.  I explained the plan to the patient  and he agrees the patient has had a bowel movement here in the emergency department.  Gelfoam was placed on the bleeding incised hemorrhoid    Charlestine Night, PA-C 06/29/15 1610  Lavera Guise, MD 06/29/15 [phone]

## 2015-06-29 NOTE — Discharge Instructions (Signed)
Return here as needed for any worsening in her condition.  Follow-up.  He surgeon provided.  He will need to soak in a warm bath as often as possible

## 2015-07-01 ENCOUNTER — Encounter (HOSPITAL_COMMUNITY): Payer: Self-pay | Admitting: Emergency Medicine

## 2015-07-01 ENCOUNTER — Inpatient Hospital Stay (HOSPITAL_COMMUNITY)
Admission: EM | Admit: 2015-07-01 | Discharge: 2015-07-06 | DRG: 348 | Disposition: A | Discharge status: Home / Self Care | Payer: BLUE CROSS/BLUE SHIELD | Attending: Internal Medicine | Admitting: Internal Medicine

## 2015-07-01 DIAGNOSIS — K645 Perianal venous thrombosis: Secondary | ICD-10-CM | POA: Diagnosis not present

## 2015-07-01 DIAGNOSIS — R109 Unspecified abdominal pain: Secondary | ICD-10-CM

## 2015-07-01 DIAGNOSIS — K567 Ileus, unspecified: Secondary | ICD-10-CM | POA: Diagnosis not present

## 2015-07-01 DIAGNOSIS — K649 Unspecified hemorrhoids: Secondary | ICD-10-CM | POA: Diagnosis present

## 2015-07-01 DIAGNOSIS — K6289 Other specified diseases of anus and rectum: Secondary | ICD-10-CM | POA: Diagnosis present

## 2015-07-01 DIAGNOSIS — R112 Nausea with vomiting, unspecified: Secondary | ICD-10-CM

## 2015-07-01 DIAGNOSIS — N179 Acute kidney failure, unspecified: Secondary | ICD-10-CM | POA: Diagnosis present

## 2015-07-01 DIAGNOSIS — K648 Other hemorrhoids: Secondary | ICD-10-CM

## 2015-07-01 DIAGNOSIS — E860 Dehydration: Secondary | ICD-10-CM | POA: Diagnosis present

## 2015-07-01 MED ORDER — SODIUM CHLORIDE 0.9 % IV BOLUS (SEPSIS)
1000.0000 mL | Freq: Once | INTRAVENOUS | Status: AC
  Administered 2015-07-01: 1000 mL via INTRAVENOUS

## 2015-07-01 MED ORDER — ONDANSETRON HCL 4 MG/2ML IJ SOLN
4.0000 mg | Freq: Once | INTRAMUSCULAR | Status: AC
  Administered 2015-07-02: 4 mg via INTRAVENOUS
  Filled 2015-07-01: qty 2

## 2015-07-01 NOTE — ED Provider Notes (Signed)
CSN: [id number]     Arrival date & time 07/01/15  2313 History  This chart was scribed for Marisa Severin, MD by Evon Slack, ED Scribe. This patient was seen in room WA16/WA16 and the patient's care was started at 11:30 PM.      Chief Complaint  Patient presents with  . Hemorrhoids   The history is provided by the patient. No language interpreter was used.   HPI Comments: Bruce Shelton is a 29 y.o. male who presents to the Emergency Department complaining of rectal pain onset 4 days prior. Pt s/p I&D of thrombosed hemorrhoid at urgent care on 8/17, seen back at urgent care later that day, referred on to the ER.  Pt prescribed perocet, colace, proctofoam at that time.  Telephone consult to general surgeon at that time.  Pt reports abdominal cramping, nausea, vomiting and fever 2 days ago. Pt reports noticing some yellow drainage coming from the hemorrhoid. Pt states that his last BM was 3 days ago. Pt has been applying anorectal lidocaine and rectal foam with no relief. Pt states that he has been taking an OTC stool softener with no relief. Pt states that he has a HX of hemorrhoids. Pt states that he has recently saw a surgeon 2 days ago about getting the hemorrhoids removed. Pt has been unable to eat/drink in 2 days due to persistent n/v.  Past Medical History  Diagnosis Date  . Hemorrhoid    History reviewed. No pertinent past surgical history. No family history on file. Social History  Substance Use Topics  . Smoking status: Never Smoker   . Smokeless tobacco: None  . Alcohol Use: No    Review of Systems  Gastrointestinal: Positive for nausea, vomiting and rectal pain.  All other systems reviewed and are negative.    Allergies  Review of patient's allergies indicates no known allergies.  Home Medications   Prior to Admission medications   Medication Sig Start Date End Date Taking? Authorizing Provider  docusate sodium (COLACE) 100 MG capsule Take 1 capsule (100 mg total) by  mouth every 12 (twelve) hours. 06/29/15  Yes Christopher Lawyer, PA-C  hydrocortisone-pramoxine (PROCTOFOAM HC) rectal foam Place 1 applicator rectally 2 (two) times daily. 06/29/15  Yes Christopher Lawyer, PA-C  ibuprofen (ADVIL,MOTRIN) 200 MG tablet Take 800 mg by mouth every 6 (six) hours as needed for moderate pain.   Yes Historical Provider, MD  Lidocaine, Anorectal, 5 % CREA Apply 1 application topically 3 (three) times daily. 06/29/15  Yes Charlestine Night, PA-C  HYDROcodone-acetaminophen (NORCO/VICODIN) 5-325 MG per tablet Take 1 tablet by mouth every 4 (four) hours as needed for pain. Patient not taking: Reported on 12/19/2014 01/11/13   Azalia Bilis, MD  ondansetron [facility] ODT) 8 MG disintegrating tablet Take 1 tablet (8 mg total) by mouth every 8 (eight) hours as needed for nausea. Patient not taking: Reported on 12/19/2014 01/11/13   Azalia Bilis, MD  oxyCODONE-acetaminophen (PERCOCET/ROXICET) 5-325 MG per tablet Take 1 tablet by mouth every 6 (six) hours as needed for severe pain. Patient not taking: Reported on 07/01/2015 06/29/15   Charlestine Night, PA-C   BP 108/71 mmHg  Pulse 80  Temp(Src) 97.8 F (36.6 C)  Resp 17  Ht 6\' 1"  (1.854 m)  Wt 180 lb (81.647 kg)  BMI 23.75 kg/m2  SpO2 100%   Physical Exam  Constitutional: He is oriented to person, place, and time. He appears well-developed and well-nourished. He appears distressed.  HENT:  Head: Normocephalic and atraumatic.  Nose:  Nose normal.  Mouth/Throat: Oropharynx is clear and moist.  Dry mucous membranes, cracked lips  Eyes: Conjunctivae and EOM are normal. Pupils are equal, round, and reactive to light.  Neck: Normal range of motion. Neck supple. No JVD present. No tracheal deviation present. No thyromegaly present.  Cardiovascular: Regular rhythm, normal heart sounds and intact distal pulses.  Exam reveals no gallop and no friction rub.   No murmur heard. tachcardia  Pulmonary/Chest: Effort normal and breath sounds  normal. No stridor. No respiratory distress. He has no wheezes. He has no rales. He exhibits no tenderness.  Abdominal: Soft. Bowel sounds are normal. He exhibits no distension and no mass. There is tenderness (diffuse tenderness). There is no rebound and no guarding.  Increased bowel sounds  Genitourinary:  Pt has large edematous hemorrhoids, circumferential.  He has skin breakdown of the hemorrhoids at 3 and 9 oclock with some yellow purulence.  No thrombosis identified, but exam is limited by patient's severe pain.  Pt does not tolerate rectal exam  Musculoskeletal: Normal range of motion. He exhibits no edema or tenderness.  Lymphadenopathy:    He has no cervical adenopathy.  Neurological: He is alert and oriented to person, place, and time. He displays normal reflexes. He exhibits normal muscle tone. Coordination normal.  Skin: Skin is warm and dry. No rash noted. No erythema. No pallor.  Psychiatric: He has a normal mood and affect. His behavior is normal. Judgment and thought content normal.  Nursing note and vitals reviewed.   ED Course  Procedures (including critical care time) DIAGNOSTIC STUDIES: Oxygen Saturation is 100% on RA, normal by my interpretation.    COORDINATION OF CARE: 11:49 PM-Discussed treatment plan with pt at bedside and pt agreed to plan.    Labs Review Labs Reviewed  BASIC METABOLIC PANEL - Abnormal; Notable for the following:    Sodium 133 (*)    Potassium 3.4 (*)    Chloride 97 (*)    Glucose, Bld 111 (*)    BUN 30 (*)    Creatinine, Ser 1.80 (*)    GFR calc non Af Amer 50 (*)    GFR calc Af Amer 58 (*)    All other components within normal limits  CBC WITH DIFFERENTIAL/PLATELET - Abnormal; Notable for the following:    Monocytes Absolute 1.1 (*)    All other components within normal limits  URINALYSIS, ROUTINE W REFLEX MICROSCOPIC (NOT AT [facility]) - Abnormal; Notable for the following:    APPearance CLOUDY (*)    Hgb urine dipstick SMALL (*)     Ketones, ur 15 (*)    All other components within normal limits  URINE MICROSCOPIC-ADD ON - Abnormal; Notable for the following:    Casts HYALINE CASTS (*)    All other components within normal limits    Imaging Review Ct Abdomen Pelvis W Contrast  07/02/2015   CLINICAL DATA:  Rectal pain and drainage 4 days. Severe hemorrhoids with concern for abscess.  EXAM: CT ABDOMEN AND PELVIS WITH CONTRAST  TECHNIQUE: Multidetector CT imaging of the abdomen and pelvis was performed using the standard protocol following bolus administration of intravenous contrast.  CONTRAST:  50mL OMNIPAQUE IOHEXOL 300 MG/ML SOLN, OMNIPAQUE IOHEXOL 300 MG/ML SOLN  COMPARISON:  None.  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  LOWER CHEST: Fatty Bochdalek's hernia on the left.  ABDOMEN/PELVIS:  Liver: No focal abnormality.  Biliary: No evidence of biliary obstruction or stone.  Pancreas: Unremarkable.  Spleen: Unremarkable.  Adrenals: Unremarkable.  Kidneys and ureters: No hydronephrosis or stone.  Bladder: Unremarkable.  Reproductive: No pathologic findings.  Bowel: Lobulated thickening at the anus correlating with history of hemorrhoids. By imaging, rectal prolapse, mass or verrucae would have the same appearance. There is no fluid collection or inflammatory change. No soft tissue gas. No bowel obstruction. No appendicitis (appendix in shallow right inguinal hernia).  Retroperitoneum: No mass or adenopathy.  Peritoneum: No ascites or pneumoperitoneum.  Vascular: No acute abnormality.  OSSEOUS: No acute abnormalities.  IMPRESSION: 1. Anal mass correlating with history of severe hemorrhoids, prolapsed appearing. No abscess. 2. Shallow right inguinal hernia containing appendix.   Electronically Signed   By: Marnee Spring M.D.   On: 07/02/2015 03:07   I have personally reviewed and evaluated these images and lab results as part of my medical decision-making.   EKG Interpretation None       MDM   Final diagnoses:  Dehydration  Non-intractable vomiting with nausea, vomiting of unspecified type  Prolapsed hemorrhoids      I personally performed the services described in this documentation, which was scribed in my presence. The recorded information has been reviewed and is accurate.  29 yo male with swollen hemorrhoids s/p I&D 4 days ago, now with dehydration, n/v, and some purulent drainage from area.  Will consult surgery for recommendations.  12:35 AM Case d/w Dr Abbey Chatters.  He recommends lidocaine and ice to help with swelling, agrees with CT scan for evaluation of possible infection, ileus, and pain control.  CT scan without ileus or infection.  Patient still with persistent pain, reports nausea.  Case discussed with hospitalist for admission.     Marisa Severin, MD 07/02/15 [phone]

## 2015-07-01 NOTE — ED Notes (Signed)
Pt seen several times for same thrombosed hemorrhoid, pt states he did see surgeon Thursday, uncontrolled pain, nausea/vomiting, pt states hemorrhoid now draining with color change.

## 2015-07-02 ENCOUNTER — Emergency Department (HOSPITAL_COMMUNITY): Payer: BLUE CROSS/BLUE SHIELD

## 2015-07-02 ENCOUNTER — Encounter (HOSPITAL_COMMUNITY): Payer: Self-pay

## 2015-07-02 DIAGNOSIS — K6289 Other specified diseases of anus and rectum: Secondary | ICD-10-CM | POA: Diagnosis present

## 2015-07-02 DIAGNOSIS — R109 Unspecified abdominal pain: Secondary | ICD-10-CM | POA: Diagnosis not present

## 2015-07-02 DIAGNOSIS — R5082 Postprocedural fever: Secondary | ICD-10-CM | POA: Diagnosis not present

## 2015-07-02 DIAGNOSIS — K649 Unspecified hemorrhoids: Secondary | ICD-10-CM | POA: Diagnosis present

## 2015-07-02 DIAGNOSIS — E860 Dehydration: Secondary | ICD-10-CM | POA: Diagnosis present

## 2015-07-02 DIAGNOSIS — K567 Ileus, unspecified: Secondary | ICD-10-CM | POA: Diagnosis not present

## 2015-07-02 DIAGNOSIS — N179 Acute kidney failure, unspecified: Secondary | ICD-10-CM | POA: Diagnosis present

## 2015-07-02 DIAGNOSIS — K645 Perianal venous thrombosis: Secondary | ICD-10-CM | POA: Diagnosis present

## 2015-07-02 LAB — COMPREHENSIVE METABOLIC PANEL
ALK PHOS: 57 U/L (ref 38–126)
ALT: 27 U/L (ref 17–63)
AST: 26 U/L (ref 15–41)
Albumin: 4 g/dL (ref 3.5–5.0)
Anion gap: 7 (ref 5–15)
BILIRUBIN TOTAL: 1.1 mg/dL (ref 0.3–1.2)
BUN: 23 mg/dL — ABNORMAL HIGH (ref 6–20)
CALCIUM: 8.6 mg/dL — AB (ref 8.9–10.3)
CHLORIDE: 103 mmol/L (ref 101–111)
CO2: 25 mmol/L (ref 22–32)
CREATININE: 1.27 mg/dL — AB (ref 0.61–1.24)
Glucose, Bld: 99 mg/dL (ref 65–99)
Potassium: 3.4 mmol/L — ABNORMAL LOW (ref 3.5–5.1)
Sodium: 135 mmol/L (ref 135–145)
TOTAL PROTEIN: 7 g/dL (ref 6.5–8.1)

## 2015-07-02 LAB — CBC WITH DIFFERENTIAL/PLATELET
BASOS ABS: 0.1 10*3/uL (ref 0.0–0.1)
BASOS ABS: 0 10*3/uL (ref 0.0–0.1)
BASOS PCT: 1 % (ref 0–1)
BASOS PCT: 0 % (ref 0–1)
Basophils Absolute: 0 10*3/uL (ref 0.0–0.1)
Basophils Relative: 0 % (ref 0–1)
EOS ABS: 0 10*3/uL (ref 0.0–0.7)
EOS ABS: 0.1 10*3/uL (ref 0.0–0.7)
EOS ABS: 0.1 10*3/uL (ref 0.0–0.7)
EOS PCT: 0 % (ref 0–5)
EOS PCT: 1 % (ref 0–5)
Eosinophils Relative: 1 % (ref 0–5)
HCT: 39.1 % (ref 39.0–52.0)
HCT: 46.2 % (ref 39.0–52.0)
HEMATOCRIT: 39.1 % (ref 39.0–52.0)
HEMOGLOBIN: 13.4 g/dL (ref 13.0–17.0)
HEMOGLOBIN: 13.1 g/dL (ref 13.0–17.0)
Hemoglobin: 16.2 g/dL (ref 13.0–17.0)
LYMPHS ABS: 3.2 10*3/uL (ref 0.7–4.0)
Lymphocytes Relative: 31 % (ref 12–46)
Lymphocytes Relative: 36 % (ref 12–46)
Lymphocytes Relative: 39 % (ref 12–46)
Lymphs Abs: 4.1 10*3/uL — ABNORMAL HIGH (ref 0.7–4.0)
Lymphs Abs: 2.6 10*3/uL (ref 0.7–4.0)
MCH: 29.6 pg (ref 26.0–34.0)
MCH: 30.1 pg (ref 26.0–34.0)
MCH: 30.2 pg (ref 26.0–34.0)
MCHC: 33.5 g/dL (ref 30.0–36.0)
MCHC: 34.3 g/dL (ref 30.0–36.0)
MCHC: 35.1 g/dL (ref 30.0–36.0)
MCV: 85.9 fL (ref 78.0–100.0)
MCV: 88.1 fL (ref 78.0–100.0)
MCV: 88.3 fL (ref 78.0–100.0)
MONOS PCT: 10 % (ref 3–12)
Monocytes Absolute: 0.6 10*3/uL (ref 0.1–1.0)
Monocytes Absolute: 1.1 10*3/uL — ABNORMAL HIGH (ref 0.1–1.0)
Monocytes Absolute: 1.4 10*3/uL — ABNORMAL HIGH (ref 0.1–1.0)
Monocytes Relative: 13 % — ABNORMAL HIGH (ref 3–12)
Monocytes Relative: 9 % (ref 3–12)
NEUTROS ABS: 3.8 10*3/uL (ref 1.7–7.7)
NEUTROS PCT: 46 % (ref 43–77)
NEUTROS PCT: 54 % (ref 43–77)
Neutro Abs: 4.9 10*3/uL (ref 1.7–7.7)
Neutro Abs: 6.1 10*3/uL (ref 1.7–7.7)
Neutrophils Relative %: 59 % (ref 43–77)
PLATELETS: 158 10*3/uL (ref 150–400)
PLATELETS: 180 10*3/uL (ref 150–400)
Platelets: 160 10*3/uL (ref 150–400)
RBC: 4.43 MIL/uL (ref 4.22–5.81)
RBC: 4.44 MIL/uL (ref 4.22–5.81)
RBC: 5.38 MIL/uL (ref 4.22–5.81)
RDW: 12.6 % (ref 11.5–15.5)
RDW: 12.7 % (ref 11.5–15.5)
RDW: 12.8 % (ref 11.5–15.5)
WBC: 10.5 10*3/uL (ref 4.0–10.5)
WBC: 10.5 10*3/uL (ref 4.0–10.5)
WBC: 7 10*3/uL (ref 4.0–10.5)

## 2015-07-02 LAB — RAPID URINE DRUG SCREEN, HOSP PERFORMED
Amphetamines: NOT DETECTED
BARBITURATES: NOT DETECTED
BENZODIAZEPINES: NOT DETECTED
COCAINE: NOT DETECTED
OPIATES: POSITIVE — AB
Tetrahydrocannabinol: POSITIVE — AB

## 2015-07-02 LAB — BASIC METABOLIC PANEL
Anion gap: 14 (ref 5–15)
BUN: 30 mg/dL — AB (ref 6–20)
CHLORIDE: 97 mmol/L — AB (ref 101–111)
CO2: 22 mmol/L (ref 22–32)
CREATININE: 1.8 mg/dL — AB (ref 0.61–1.24)
Calcium: 9.7 mg/dL (ref 8.9–10.3)
GFR calc Af Amer: 58 mL/min — ABNORMAL LOW (ref >60)
GFR calc non Af Amer: 50 mL/min — ABNORMAL LOW (ref >60)
GLUCOSE: 111 mg/dL — AB (ref 65–99)
Potassium: 3.4 mmol/L — ABNORMAL LOW (ref 3.5–5.1)
SODIUM: 133 mmol/L — AB (ref 135–145)

## 2015-07-02 LAB — URINALYSIS, ROUTINE W REFLEX MICROSCOPIC
Bilirubin Urine: NEGATIVE
GLUCOSE, UA: NEGATIVE mg/dL
KETONES UR: 15 mg/dL — AB
Leukocytes, UA: NEGATIVE
Nitrite: NEGATIVE
PH: 6 (ref 5.0–8.0)
PROTEIN: NEGATIVE mg/dL
Specific Gravity, Urine: 1.018 (ref 1.005–1.030)
Urobilinogen, UA: 1 mg/dL (ref 0.0–1.0)

## 2015-07-02 LAB — URINE MICROSCOPIC-ADD ON

## 2015-07-02 LAB — PROTIME-INR
INR: 1.12 (ref 0.00–1.49)
PROTHROMBIN TIME: 14.6 s (ref 11.6–15.2)

## 2015-07-02 MED ORDER — ONDANSETRON HCL 4 MG PO TABS: 4.0000 mg | ORAL_TABLET | Freq: Four times a day (QID) | ORAL | Status: DC | PRN

## 2015-07-02 MED ORDER — ACETAMINOPHEN 325 MG PO TABS: 650.0000 mg | ORAL_TABLET | Freq: Four times a day (QID) | ORAL | Status: DC | PRN

## 2015-07-02 MED ORDER — LIDOCAINE-HYDROCORTISONE ACE 3-0.5 % RE CREA
1.0000 | TOPICAL_CREAM | Freq: Three times a day (TID) | RECTAL | Status: DC
  Administered 2015-07-02 (×3): 1 via RECTAL
  Filled 2015-07-02: qty 7

## 2015-07-02 MED ORDER — PSYLLIUM 95 % PO PACK
1.0000 | PACK | Freq: Every day | ORAL | Status: DC
  Administered 2015-07-02 – 2015-07-06 (×4): 1 via ORAL
  Filled 2015-07-02 (×5): qty 1

## 2015-07-02 MED ORDER — OXYCODONE-ACETAMINOPHEN 5-325 MG PO TABS
1.0000 | ORAL_TABLET | Freq: Four times a day (QID) | ORAL | Status: DC | PRN
  Administered 2015-07-02 – 2015-07-03 (×2): 1 via ORAL
  Filled 2015-07-02 (×2): qty 1

## 2015-07-02 MED ORDER — HYDROMORPHONE HCL 1 MG/ML IJ SOLN
0.5000 mg | INTRAMUSCULAR | Status: DC | PRN
  Administered 2015-07-02 – 2015-07-03 (×5): 0.5 mg via INTRAVENOUS
  Filled 2015-07-02 (×5): qty 1

## 2015-07-02 MED ORDER — PHENYLEPHRINE IN HARD FAT 0.25 % RE SUPP
1.0000 | Freq: Two times a day (BID) | RECTAL | Status: DC
  Administered 2015-07-02: 1 via RECTAL
  Filled 2015-07-02 (×4): qty 1

## 2015-07-02 MED ORDER — SODIUM CHLORIDE 0.9 % IV SOLN
INTRAVENOUS | Status: DC
  Administered 2015-07-02 – 2015-07-05 (×7): via INTRAVENOUS

## 2015-07-02 MED ORDER — IOHEXOL 300 MG/ML  SOLN
100.0000 mL | Freq: Once | INTRAMUSCULAR | Status: AC | PRN
  Administered 2015-07-02: 100 mL via INTRAVENOUS

## 2015-07-02 MED ORDER — HYDROMORPHONE HCL 1 MG/ML IJ SOLN
1.0000 mg | Freq: Once | INTRAMUSCULAR | Status: AC
  Administered 2015-07-02: 1 mg via INTRAVENOUS
  Filled 2015-07-02: qty 1

## 2015-07-02 MED ORDER — IOHEXOL 300 MG/ML  SOLN
50.0000 mL | Freq: Once | INTRAMUSCULAR | Status: AC | PRN
  Administered 2015-07-02: 50 mL via ORAL

## 2015-07-02 MED ORDER — SODIUM CHLORIDE 0.9 % IV BOLUS (SEPSIS)
1000.0000 mL | Freq: Once | INTRAVENOUS | Status: AC
  Administered 2015-07-02: 1000 mL via INTRAVENOUS

## 2015-07-02 MED ORDER — ONDANSETRON HCL 4 MG/2ML IJ SOLN
4.0000 mg | Freq: Four times a day (QID) | INTRAMUSCULAR | Status: DC | PRN
  Administered 2015-07-04: 4 mg via INTRAVENOUS
  Filled 2015-07-02: qty 2

## 2015-07-02 MED ORDER — HEPARIN SODIUM (PORCINE) 5000 UNIT/ML IJ SOLN
5000.0000 [IU] | Freq: Three times a day (TID) | INTRAMUSCULAR | Status: DC
  Administered 2015-07-02 (×3): 5000 [IU] via SUBCUTANEOUS
  Filled 2015-07-02 (×7): qty 1

## 2015-07-02 MED ORDER — HYDROCORTISONE ACE-PRAMOXINE 1-1 % RE FOAM
1.0000 | Freq: Two times a day (BID) | RECTAL | Status: DC
  Administered 2015-07-02 – 2015-07-05 (×6): 1 via RECTAL
  Filled 2015-07-02 (×2): qty 10

## 2015-07-02 MED ORDER — ACETAMINOPHEN 650 MG RE SUPP: 650.0000 mg | Freq: Four times a day (QID) | RECTAL | Status: DC | PRN

## 2015-07-02 MED ORDER — LIDOCAINE HCL 2 % EX GEL: 1.0000 "application " | Freq: Once | CUTANEOUS | Status: DC

## 2015-07-02 MED ORDER — ZOLPIDEM TARTRATE 5 MG PO TABS
5.0000 mg | ORAL_TABLET | Freq: Every evening | ORAL | Status: DC | PRN
  Administered 2015-07-02 – 2015-07-06 (×3): 5 mg via ORAL
  Filled 2015-07-02 (×3): qty 1

## 2015-07-02 NOTE — Progress Notes (Signed)
Hemorrhoids: symptomatic control Dehydration/arf: ua unremarkable, on ivf, avoid ibuprofen N/v: ct ab on obstruction, antiemetics, ivf Surgery consulted.

## 2015-07-02 NOTE — ED Notes (Signed)
Small amount of yellow discharge noted on ice pack located underneath pt. Ice chips given to pt upon request for something to drink.

## 2015-07-02 NOTE — H&P (Signed)
[facility] History and Physical  Patient: Bruce Shelton  MRN: [id number]  DOB: 09-29-1986  DOS: the patient was seen and examined on 07/02/2015 PCP: Karie Chimera, MD  Referring physician: Dr. Norlene Campbell Chief Complaint: Hemorrhoids  HPI: Bruce Shelton is a 29 y.o. male with Past medical history of hemorrhoid. The patient presents with hemorrhoid associated with pain. Patient mentions this has been ongoing since last 4 weeks. Patient was initially seen in ER on 8/ at which time his hemorrhoid was noted as grape sized. Patient was discussed with general surgery and was discharged home with sitz baths and stool softener as well as pain control. Patient has been taking when necessary ibuprofen. Patient has been having daily vomiting and has significant nausea and because of that has poor oral intake. He complains of lower abdominal pain. He also complains of significant pain with medication. He complains of bleeding that the stool as well. Denies any fever or chills denies any chest pain or shortness of breath. He had an outpatient follow-up and at which time he was recommended for ice application.  The patient is coming from home.  At his baseline ambulates without any support And is independent for most of his ADL manages his medication on his own.  Review of Systems: as mentioned in the history of present illness.  A comprehensive review of the other systems is negative.  Past Medical History  Diagnosis Date  . Hemorrhoid    History reviewed. No pertinent past surgical history. Social History:  reports that he has never smoked. He does not have any smokeless tobacco history on file. He reports that he does not drink alcohol or use illicit drugs.  No Known Allergies  No family history on file.  Prior to Admission medications   Medication Sig Start Date End Date Taking? Authorizing Provider  docusate sodium (COLACE) 100 MG capsule Take 1 capsule (100 mg total) by mouth every  12 (twelve) hours. 06/29/15  Yes Christopher Lawyer, PA-C  hydrocortisone-pramoxine (PROCTOFOAM HC) rectal foam Place 1 applicator rectally 2 (two) times daily. 06/29/15  Yes Christopher Lawyer, PA-C  ibuprofen (ADVIL,MOTRIN) 200 MG tablet Take 800 mg by mouth every 6 (six) hours as needed for moderate pain.   Yes Historical Provider, MD  Lidocaine, Anorectal, 5 % CREA Apply 1 application topically 3 (three) times daily. 06/29/15  Yes Charlestine Night, PA-C  HYDROcodone-acetaminophen (NORCO/VICODIN) 5-325 MG per tablet Take 1 tablet by mouth every 4 (four) hours as needed for pain. Patient not taking: Reported on 12/19/2014 01/11/13   Azalia Bilis, MD  ondansetron [facility] ODT) 8 MG disintegrating tablet Take 1 tablet (8 mg total) by mouth every 8 (eight) hours as needed for nausea. Patient not taking: Reported on 12/19/2014 01/11/13   Azalia Bilis, MD  oxyCODONE-acetaminophen (PERCOCET/ROXICET) 5-325 MG per tablet Take 1 tablet by mouth every 6 (six) hours as needed for severe pain. Patient not taking: Reported on 07/01/2015 06/29/15   Charlestine Night, PA-C    Physical Exam: Filed Vitals:   07/01/15 2317 07/02/15 0125 07/02/15 0424 07/02/15 0517  BP: 108/71 128/82 109/56 102/53  Pulse: 80 58 57 57  Temp: 97.8 F (36.6 C)  97.8 F (36.6 C) 98.2 F (36.8 C)  TempSrc:   Oral Oral  Resp: 17 16 20 20   Height: 6\' 1"  (1.854 m)     Weight: 81.647 kg (180 lb)     SpO2: 100% 100% 95% 97%    General: Alert, Awake and Oriented to Time, Place and Person. Appear  in moderate distress Eyes: PERRL ENT: Oral Mucosa clear moist. Neck: no JVD Cardiovascular: S1 and S2 Present, no Murmur, Peripheral Pulses Present Respiratory: Bilateral Air entry equal and Decreased,  Clear to Auscultation, no Crackles, o wheezes Abdomen: Bowel Sound present, Soft and no tenderness Rectal examination large visible swollen thrombosed hemorrhoid seen. Skin: no Rash Extremities: no Pedal edema, no calf tenderness Neurologic:  Grossly no focal neuro deficit.  Labs on Admission:  CBC:  Recent Labs Lab 06/28/15 2053 07/01/15 2346  WBC 10.6* 10.5  NEUTROABS  --  6.1  HGB 15.1 16.2  HCT 43.1 46.2  MCV 87.2 85.9  PLT 188 180    CMP     Component Value Date/Time   NA 133* 07/01/2015 2346   K 3.4* 07/01/2015 2346   CL 97* 07/01/2015 2346   CO2 22 07/01/2015 2346   GLUCOSE 111* 07/01/2015 2346   BUN 30* 07/01/2015 2346   CREATININE 1.80* 07/01/2015 2346   CALCIUM 9.7 07/01/2015 2346   PROT 8.1 06/28/2015 2053   ALBUMIN 4.3 06/28/2015 2053   AST 36 06/28/2015 2053   ALT 43 06/28/2015 2053   ALKPHOS 71 06/28/2015 2053   BILITOT 0.8 06/28/2015 2053   GFRNONAA 50* 07/01/2015 2346   GFRAA 58* 07/01/2015 2346    No results for input(s): LIPASE, AMYLASE in the last 168 hours.  No results for input(s): CKTOTAL, CKMB, CKMBINDEX, TROPONINI in the last 168 hours. BNP (last 3 results) No results for input(s): BNP in the last 8760 hours.  ProBNP (last 3 results) No results for input(s): PROBNP in the last 8760 hours.   Radiological Exams on Admission: Ct Abdomen Pelvis W Contrast  07/02/2015   CLINICAL DATA:  Rectal pain and drainage 4 days. Severe hemorrhoids with concern for abscess.  EXAM: CT ABDOMEN AND PELVIS WITH CONTRAST  TECHNIQUE: Multidetector CT imaging of the abdomen and pelvis was performed using the standard protocol following bolus administration of intravenous contrast.  CONTRAST:  50mL OMNIPAQUE IOHEXOL 300 MG/ML SOLN, OMNIPAQUE IOHEXOL 300 MG/ML SOLN  COMPARISON:  None.  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  LOWER CHEST: Fatty Bochdalek's hernia on the left.  ABDOMEN/PELVIS:  Liver: No focal abnormality.  Biliary: No evidence of biliary obstruction or stone.  Pancreas: Unremarkable.  Spleen: Unremarkable.  Adrenals: Unremarkable.  Kidneys and ureters: No hydronephrosis or stone.  Bladder: Unremarkable.  Reproductive: No pathologic findings.  Bowel:  Lobulated thickening at the anus correlating with history of hemorrhoids. By imaging, rectal prolapse, mass or verrucae would have the same appearance. There is no fluid collection or inflammatory change. No soft tissue gas. No bowel obstruction. No appendicitis (appendix in shallow right inguinal hernia).  Retroperitoneum: No mass or adenopathy.  Peritoneum: No ascites or pneumoperitoneum.  Vascular: No acute abnormality.  OSSEOUS: No acute abnormalities.  IMPRESSION: 1. Anal mass correlating with history of severe hemorrhoids, prolapsed appearing. No abscess. 2. Shallow right inguinal hernia containing appendix.   Electronically Signed   By: Marnee Spring M.D.   On: 07/02/2015 03:07   Assessment/Plan Principal Problem:   AKI (acute kidney injury) Active Problems:   Dehydration   Hemorrhoid   1. AKI (acute kidney injury) The patient is presenting with complaints of nausea as poor oral intake. He has developed acute kidney injury. This might be a combination of prerenal etiology associated with use of ibuprofen. Currently I would hydrate him aggressively. We will hold ibuprofen. Monitor renal function. Avoid nephrotoxic medication.  2. Hemorrhoids with  thrombosis. The patient has developed hemorrhoids and a CT scan of the abdomen and pelvis was performed as per discussion with general surgery from ER. General surgery currently recommends symptomatic management with ice application. General surgery recommends no surgical intervention at present. I would also add Preparation H with phenylephrine. Continue with sitz bath, hydrocortisone, lidocaine application We will add Metamucil.  3. Dehydration. Continue IV hydration.    Consults: Gen. surgery by ER  DVT Prophylaxis: subcutaneous Heparin Nutrition: Regular diet  Family Communication: family was present at bedside, opportunity was given to ask question and all questions were answered satisfactorily at the time of  interview. Disposition: Admitted as inpatient, med-surge unit.  Author: Lynden Oxford, MD [facility] Pager: [phone] 07/02/2015  If 7PM-7AM, please contact night-coverage [email or web address] Password [email or web address]

## 2015-07-02 NOTE — ED Notes (Signed)
Pt completed po contrast. Awaiting CT.

## 2015-07-03 ENCOUNTER — Inpatient Hospital Stay (HOSPITAL_COMMUNITY): Payer: BLUE CROSS/BLUE SHIELD | Admitting: Anesthesiology

## 2015-07-03 ENCOUNTER — Encounter (HOSPITAL_COMMUNITY)
Admission: EM | Disposition: A | Discharge status: Home / Self Care | Payer: Self-pay | Source: Home / Self Care | Attending: Internal Medicine

## 2015-07-03 ENCOUNTER — Encounter (HOSPITAL_COMMUNITY): Payer: Self-pay | Admitting: Orthopedic Surgery

## 2015-07-03 DIAGNOSIS — K649 Unspecified hemorrhoids: Secondary | ICD-10-CM

## 2015-07-03 HISTORY — PX: HEMORRHOID SURGERY: SHX153

## 2015-07-03 LAB — BASIC METABOLIC PANEL
ANION GAP: 6 (ref 5–15)
BUN: 13 mg/dL (ref 6–20)
CO2: 26 mmol/L (ref 22–32)
Calcium: 8.5 mg/dL — ABNORMAL LOW (ref 8.9–10.3)
Chloride: 103 mmol/L (ref 101–111)
Creatinine, Ser: 1 mg/dL (ref 0.61–1.24)
GFR calc Af Amer: >60 mL/min (ref >60)
GLUCOSE: 123 mg/dL — AB (ref 65–99)
POTASSIUM: 3.3 mmol/L — AB (ref 3.5–5.1)
Sodium: 135 mmol/L (ref 135–145)

## 2015-07-03 LAB — SURGICAL PCR SCREEN
MRSA, PCR: NEGATIVE
STAPHYLOCOCCUS AUREUS: POSITIVE — AB

## 2015-07-03 LAB — MAGNESIUM: Magnesium: 2.1 mg/dL (ref 1.7–2.4)

## 2015-07-03 SURGERY — HEMORRHOIDECTOMY
Anesthesia: Epidural | Site: Rectum

## 2015-07-03 MED ORDER — HYDROMORPHONE HCL 1 MG/ML IJ SOLN
0.5000 mg | INTRAMUSCULAR | Status: DC | PRN
  Administered 2015-07-04 – 2015-07-05 (×3): 0.5 mg via INTRAVENOUS
  Administered 2015-07-05: 1 mg via INTRAVENOUS
  Filled 2015-07-03 (×4): qty 1

## 2015-07-03 MED ORDER — ESMOLOL HCL 10 MG/ML IV SOLN
INTRAVENOUS | Status: DC | PRN
  Administered 2015-07-03: 20 mg via INTRAVENOUS

## 2015-07-03 MED ORDER — LACTATED RINGERS IV SOLN
INTRAVENOUS | Status: DC
  Administered 2015-07-03: 16:00:00 via INTRAVENOUS
  Administered 2015-07-03: 1000 mL via INTRAVENOUS

## 2015-07-03 MED ORDER — CEFOTETAN DISODIUM-DEXTROSE 2-2.08 GM-% IV SOLR
INTRAVENOUS | Status: AC
  Filled 2015-07-03: qty 50

## 2015-07-03 MED ORDER — SUGAMMADEX SODIUM 200 MG/2ML IV SOLN
INTRAVENOUS | Status: DC | PRN
  Administered 2015-07-03: 200 mg via INTRAVENOUS

## 2015-07-03 MED ORDER — BUPIVACAINE LIPOSOME 1.3 % IJ SUSP
20.0000 mL | Freq: Once | INTRAMUSCULAR | Status: AC
  Administered 2015-07-03: 17 mL
  Filled 2015-07-03: qty 20

## 2015-07-03 MED ORDER — SENNOSIDES 8.8 MG/5ML PO SYRP
5.0000 mL | ORAL_SOLUTION | Freq: Two times a day (BID) | ORAL | Status: DC
  Filled 2015-07-03 (×2): qty 5

## 2015-07-03 MED ORDER — DEXTROSE 5 % IV SOLN
2.0000 g | Freq: Once | INTRAVENOUS | Status: AC
  Administered 2015-07-03: 2 g via INTRAVENOUS
  Filled 2015-07-03: qty 2

## 2015-07-03 MED ORDER — FENTANYL CITRATE (PF) 100 MCG/2ML IJ SOLN
INTRAMUSCULAR | Status: AC
  Filled 2015-07-03: qty 2

## 2015-07-03 MED ORDER — ROCURONIUM BROMIDE 100 MG/10ML IV SOLN
INTRAVENOUS | Status: DC | PRN
  Administered 2015-07-03: 50 mg via INTRAVENOUS

## 2015-07-03 MED ORDER — HYDROMORPHONE HCL 1 MG/ML IJ SOLN
0.2500 mg | INTRAMUSCULAR | Status: DC | PRN
  Administered 2015-07-03: 0.5 mg via INTRAVENOUS
  Administered 2015-07-03 (×2): 0.25 mg via INTRAVENOUS
  Administered 2015-07-03 (×2): 0.5 mg via INTRAVENOUS

## 2015-07-03 MED ORDER — NEOSTIGMINE METHYLSULFATE 10 MG/10ML IV SOLN
INTRAVENOUS | Status: DC | PRN
  Administered 2015-07-03: 4 mg via INTRAVENOUS

## 2015-07-03 MED ORDER — PROPOFOL 10 MG/ML IV BOLUS
INTRAVENOUS | Status: AC
  Filled 2015-07-03: qty 20

## 2015-07-03 MED ORDER — ESMOLOL HCL 10 MG/ML IV SOLN
INTRAVENOUS | Status: AC
  Filled 2015-07-03: qty 10

## 2015-07-03 MED ORDER — POTASSIUM CHLORIDE CRYS ER 20 MEQ PO TBCR
40.0000 meq | EXTENDED_RELEASE_TABLET | Freq: Once | ORAL | Status: AC
  Administered 2015-07-03: 40 meq via ORAL
  Filled 2015-07-03: qty 2

## 2015-07-03 MED ORDER — MEPERIDINE HCL 50 MG/ML IJ SOLN
6.2500 mg | INTRAMUSCULAR | Status: DC | PRN
  Administered 2015-07-03 (×2): 6.25 mg via INTRAVENOUS

## 2015-07-03 MED ORDER — DEXTROSE 5 % IV SOLN
2.0000 g | Freq: Two times a day (BID) | INTRAVENOUS | Status: AC
  Administered 2015-07-04 (×2): 2 g via INTRAVENOUS
  Filled 2015-07-03 (×2): qty 2

## 2015-07-03 MED ORDER — OXYCODONE HCL 5 MG PO TABS
ORAL_TABLET | ORAL | Status: AC
  Administered 2015-07-03: 5 mg
  Filled 2015-07-03: qty 1

## 2015-07-03 MED ORDER — LIDOCAINE-EPINEPHRINE (PF) 1.5 %-1:200000 IJ SOLN
INTRAMUSCULAR | Status: DC | PRN
  Administered 2015-07-03: 3 mL via EPIDURAL

## 2015-07-03 MED ORDER — ROPIVACAINE HCL 2 MG/ML IJ SOLN
8.0000 mL/h | INTRAMUSCULAR | Status: DC
  Administered 2015-07-03: 8 mL/h via EPIDURAL
  Filled 2015-07-03 (×2): qty 200

## 2015-07-03 MED ORDER — ONDANSETRON HCL 4 MG/2ML IJ SOLN: 4.0000 mg | Freq: Four times a day (QID) | INTRAMUSCULAR | Status: DC | PRN

## 2015-07-03 MED ORDER — OXYCODONE HCL 5 MG/5ML PO SOLN: 5.0000 mg | Freq: Once | ORAL | Status: DC | PRN

## 2015-07-03 MED ORDER — LIDOCAINE HCL (CARDIAC) 20 MG/ML IV SOLN
INTRAVENOUS | Status: DC | PRN
Start: 2015-07-03 — End: 2015-07-03
  Administered 2015-07-03: 50 mg via INTRAVENOUS

## 2015-07-03 MED ORDER — MIDAZOLAM HCL 2 MG/2ML IJ SOLN
1.0000 mg | INTRAMUSCULAR | Status: DC | PRN
Start: 2015-07-03 — End: 2015-07-03
  Administered 2015-07-03: 2 mg via INTRAVENOUS
  Filled 2015-07-03: qty 2

## 2015-07-03 MED ORDER — OXYCODONE HCL 5 MG PO TABS: 5.0000 mg | ORAL_TABLET | Freq: Once | ORAL | Status: DC | PRN

## 2015-07-03 MED ORDER — HYDROMORPHONE HCL 1 MG/ML IJ SOLN
INTRAMUSCULAR | Status: AC
  Administered 2015-07-03: 0.5 mg via INTRAVENOUS
  Filled 2015-07-03: qty 1

## 2015-07-03 MED ORDER — GLYCOPYRROLATE 0.2 MG/ML IJ SOLN
INTRAMUSCULAR | Status: DC | PRN
  Administered 2015-07-03: 0.6 mg via INTRAVENOUS

## 2015-07-03 MED ORDER — PROPOFOL 10 MG/ML IV BOLUS
INTRAVENOUS | Status: DC | PRN
  Administered 2015-07-03: 50 mg via INTRAVENOUS
  Administered 2015-07-03: 150 mg via INTRAVENOUS

## 2015-07-03 MED ORDER — MIDAZOLAM HCL 2 MG/2ML IJ SOLN
INTRAMUSCULAR | Status: AC
  Filled 2015-07-03: qty 2

## 2015-07-03 MED ORDER — FENTANYL CITRATE (PF) 100 MCG/2ML IJ SOLN
INTRAMUSCULAR | Status: DC | PRN
  Administered 2015-07-03 (×2): 50 ug via INTRAVENOUS

## 2015-07-03 MED ORDER — METOCLOPRAMIDE HCL 5 MG/ML IJ SOLN
INTRAMUSCULAR | Status: DC | PRN
  Administered 2015-07-03: 10 mg via INTRAVENOUS

## 2015-07-03 MED ORDER — BUPIVACAINE HCL (PF) 0.25 % IJ SOLN
INTRAMUSCULAR | Status: DC | PRN
  Administered 2015-07-03 (×2): 4 mL

## 2015-07-03 MED ORDER — 0.9 % SODIUM CHLORIDE (POUR BTL) OPTIME
TOPICAL | Status: DC | PRN
  Administered 2015-07-03: 1000 mL

## 2015-07-03 MED ORDER — ONDANSETRON HCL 4 MG/2ML IJ SOLN
INTRAMUSCULAR | Status: DC | PRN
  Administered 2015-07-03: 4 mg via INTRAVENOUS

## 2015-07-03 MED ORDER — FENTANYL CITRATE (PF) 100 MCG/2ML IJ SOLN
100.0000 ug | Freq: Once | INTRAMUSCULAR | Status: AC
  Administered 2015-07-03: 100 ug via INTRAVENOUS

## 2015-07-03 MED ORDER — HYDROMORPHONE HCL 1 MG/ML IJ SOLN
INTRAMUSCULAR | Status: AC
  Administered 2015-07-03: 0.25 mg via INTRAVENOUS
  Filled 2015-07-03: qty 1

## 2015-07-03 MED ORDER — SUGAMMADEX SODIUM 200 MG/2ML IV SOLN
INTRAVENOUS | Status: AC
  Filled 2015-07-03: qty 2

## 2015-07-03 MED ORDER — DOCUSATE SODIUM 100 MG PO CAPS
100.0000 mg | ORAL_CAPSULE | Freq: Two times a day (BID) | ORAL | Status: DC
  Administered 2015-07-03 – 2015-07-06 (×6): 100 mg via ORAL

## 2015-07-03 MED ORDER — BUPIVACAINE HCL (PF) 0.25 % IJ SOLN
INTRAMUSCULAR | Status: AC
  Filled 2015-07-03: qty 30

## 2015-07-03 MED ORDER — FENTANYL CITRATE (PF) 100 MCG/2ML IJ SOLN: 50.0000 ug | Freq: Once | INTRAMUSCULAR | Status: DC

## 2015-07-03 MED ORDER — POLYETHYLENE GLYCOL 3350 17 G PO PACK
17.0000 g | PACK | Freq: Every day | ORAL | Status: DC
  Administered 2015-07-04 – 2015-07-06 (×3): 17 g via ORAL

## 2015-07-03 MED ORDER — ROPIVACAINE HCL 2 MG/ML IJ SOLN
8.0000 mL/h | INTRAMUSCULAR | Status: DC
  Filled 2015-07-03 (×5): qty 200

## 2015-07-03 MED ORDER — MEPERIDINE HCL 50 MG/ML IJ SOLN
INTRAMUSCULAR | Status: AC
  Administered 2015-07-03: 6.25 mg via INTRAVENOUS
  Filled 2015-07-03: qty 1

## 2015-07-03 SURGICAL SUPPLY — 41 items
BLADE HEX COATED 2.75 (ELECTRODE) IMPLANT
BLADE SURG 15 STRL LF DISP TIS (BLADE) ×1 IMPLANT
BLADE SURG 15 STRL SS (BLADE) ×2
BRIEF STRETCH FOR OB PAD LRG (UNDERPADS AND DIAPERS) ×6 IMPLANT
COVER SURGICAL LIGHT HANDLE (MISCELLANEOUS) IMPLANT
DECANTER SPIKE VIAL GLASS SM (MISCELLANEOUS) IMPLANT
DRSG PAD ABDOMINAL 8X10 ST (GAUZE/BANDAGES/DRESSINGS) ×6 IMPLANT
ELECT PENCIL ROCKER SW 15FT (MISCELLANEOUS) ×3 IMPLANT
ELECT REM PT RETURN 9FT ADLT (ELECTROSURGICAL) ×3
ELECTRODE REM PT RTRN 9FT ADLT (ELECTROSURGICAL) ×1 IMPLANT
GAUZE SPONGE 4X4 12PLY STRL (GAUZE/BANDAGES/DRESSINGS) ×3 IMPLANT
GAUZE SPONGE 4X4 16PLY XRAY LF (GAUZE/BANDAGES/DRESSINGS) ×6 IMPLANT
GLOVE BIO SURGEON STRL SZ7.5 (GLOVE) ×3 IMPLANT
GLOVE BIOGEL PI IND STRL 7.0 (GLOVE) ×2 IMPLANT
GLOVE BIOGEL PI IND STRL 7.5 (GLOVE) ×1 IMPLANT
GLOVE BIOGEL PI INDICATOR 7.0 (GLOVE) ×4
GLOVE BIOGEL PI INDICATOR 7.5 (GLOVE) ×2
GLOVE ECLIPSE 7.5 STRL STRAW (GLOVE) ×6 IMPLANT
GLOVE ECLIPSE 8.0 STRL XLNG CF (GLOVE) ×3 IMPLANT
GLOVE INDICATOR 8.0 STRL GRN (GLOVE) ×3 IMPLANT
GLOVE SURG SS PI 6.5 STRL IVOR (GLOVE) ×3 IMPLANT
GLOVE SURG SS PI 7.5 STRL IVOR (GLOVE) ×6 IMPLANT
GOWN STRL REUS W/ TWL LRG LVL3 (GOWN DISPOSABLE) ×1 IMPLANT
GOWN STRL REUS W/TWL LRG LVL3 (GOWN DISPOSABLE) ×2
GOWN STRL REUS W/TWL XL LVL3 (GOWN DISPOSABLE) ×3 IMPLANT
KIT BASIN OR (CUSTOM PROCEDURE TRAY) ×3 IMPLANT
LUBRICANT JELLY K Y 4OZ (MISCELLANEOUS) ×3 IMPLANT
NEEDLE HYPO 25X1 1.5 SAFETY (NEEDLE) ×3 IMPLANT
PACK LITHOTOMY IV (CUSTOM PROCEDURE TRAY) IMPLANT
PAD ABD 8X10 STRL (GAUZE/BANDAGES/DRESSINGS) ×3 IMPLANT
SHEARS HARMONIC 9CM CVD (BLADE) IMPLANT
SPONGE SURGIFOAM ABS GEL 100 (HEMOSTASIS) ×3 IMPLANT
SPONGE SURGIFOAM ABS GEL 12-7 (HEMOSTASIS) IMPLANT
SUT CHROMIC 2 0 SH (SUTURE) IMPLANT
SUT CHROMIC 3 0 SH 27 (SUTURE) ×6 IMPLANT
SYR CONTROL 10ML LL (SYRINGE) ×3 IMPLANT
TAPE CLOTH 4X10 WHT NS (GAUZE/BANDAGES/DRESSINGS) ×3 IMPLANT
TOWEL OR 17X26 10 PK STRL BLUE (TOWEL DISPOSABLE) ×3 IMPLANT
TOWEL OR NON WOVEN STRL DISP B (DISPOSABLE) ×3 IMPLANT
TRAY FOLEY W/METER SILVER 16FR (SET/KITS/TRAYS/PACK) ×3 IMPLANT
YANKAUER SUCT BULB TIP 10FT TU (MISCELLANEOUS) ×3 IMPLANT

## 2015-07-03 NOTE — Op Note (Signed)
Operative Note  Bruce Shelton male 29 y.o. 07/03/2015  PREOPERATIVE DX:  Persistent, severe thrombosed external hemorrhoids  POSTOPERATIVE DX:  Same  PROCEDURE:   Examination under anesthesia.  Anal block with Exparel.   Complete external hemorrhoidectomy(Open technique)         Surgeon: Adolph Pollack   Assistants: None  Anesthesia: General endotracheal anesthesia  Indications:   This is a 29 year old male with severe thrombosed external hemorrhoids. Despite maximum medical treatment, there is been no improvement. He was admitted to the hospital over the weekend because of severe nausea and vomiting due to the pain. He actually had acute renal insufficiency. That has resolved. He now presents for hemorrhoidectomy. We discussed the procedure and risks in detail.    Procedure Detail:  He was brought to the operating room supply and on the stretcher. A general anesthetic was given on the stretcher. He did have a preoperative epidural placed. He was placed prone on the operating table and appropriately padded. His end placed in the jackknife position. The buttocks were retracted laterally. The buttocks and perianal area were sterilely prepped and draped.  Anal block was performed with Exparel.  He had significant thrombosed and engorged external hemorrhoids in a circumferential fashion. Beginning on the left side, I made an incision in the anoderm externally and anteriorly. I swept the underlying external sphincter muscle free from the swollen mucosa and hemorrhoidal veins that had multiple areas of thrombosis. Using electrocautery, I then excised the enlarged external hemorrhoid. This was sent to pathology.  In the similar fashion, on the right side I made an incision in the anoderm externally and internally and separated the external sphincter muscle from the swollen mucosa and hemorrhoidal veins. I then excised the left external hemorrhoid with electrocautery. This hemorrhoid was sent  to pathology.  I then inspected the wound. Some bleeding was controlled with electrocautery. I did not want to reapproximate the remaining mucosa to the skin as I thought he may get an ectropion and be left with a lot of itching and mucoid drainage thus I decided to leave the area open.  Once hemostasis was adequate, Gelfoam was placed into the anal cavity. A bulky dressing was applied. He was then turned prone and a Foley catheter was inserted. He subsequently was taken to the recovery room in satisfactory condition. There were no apparent complications. Blood loss was approximately 100-200 cc.

## 2015-07-03 NOTE — Anesthesia Preprocedure Evaluation (Signed)
Anesthesia Evaluation  Patient identified by MRN, date of birth, ID band Patient awake    Reviewed: Allergy & Precautions, NPO status , Patient's Chart, lab work & pertinent test results  Airway Mallampati: I   Neck ROM: full    Dental   Pulmonary neg pulmonary ROS,  breath sounds clear to auscultation        Cardiovascular negative cardio ROS  Rhythm:regular Rate:Normal     Neuro/Psych    GI/Hepatic   Endo/Other    Renal/GU      Musculoskeletal   Abdominal   Peds  Hematology   Anesthesia Other Findings   Reproductive/Obstetrics                             Anesthesia Physical Anesthesia Plan  ASA: I  Anesthesia Plan: General and Epidural   Post-op Pain Management: GA combined w/ Regional for post-op pain   Induction: Intravenous  Airway Management Planned: Oral ETT  Additional Equipment:   Intra-op Plan:   Post-operative Plan: Extubation in OR  Informed Consent: I have reviewed the patients History and Physical, chart, labs and discussed the procedure including the risks, benefits and alternatives for the proposed anesthesia with the patient or authorized representative who has indicated his/her understanding and acceptance.     Plan Discussed with: CRNA, Anesthesiologist and Surgeon  Anesthesia Plan Comments:         Anesthesia Quick Evaluation

## 2015-07-03 NOTE — Progress Notes (Addendum)
PROGRESS NOTE  Bruce Shelton ZOX:[id number] DOB: [birth year] DOA: 07/01/2015 PCP: Karie Chimera, MD  HPI/Recap of past 24 hours:  Significant hemorrhoid/rectal pain  Assessment/Plan: Principal Problem:   AKI (acute kidney injury) Active Problems:   Dehydration   Hemorrhoid  External hemorrhoid: s/o hemorrhoidectomy, pain control. Appreciate surgery input  ARF: resolved with hydration.  Code Status: full   Family Communication: patient   Disposition Plan: home when medically stable   Consultants:  General surgery  Procedures: Complete external hemorrhoidectomy(Open technique) 8/22 by Dr. Abbey Chatters  Antibiotics:  perioeprative cefotan   Objective: BP 148/77 mmHg  Pulse 71  Temp(Src) 98.6 F (37 C) (Oral)  Resp 20  Ht  (1.854 m)  Wt 180 lb (81.647 kg)  BMI 23.75 kg/m2  SpO2 100%  Intake/Output Summary (Last 24 hours) at 07/03/15 1902 Last data filed at 07/03/15 1700  Gross per 24 hour  Intake   3840 ml  Output    600 ml  Net   3240 ml   Filed Weights   07/01/15 2317 07/03/15 1715  Weight: 180 lb (81.647 kg) 180 lb (81.647 kg)    Exam:   General:  In pain  Cardiovascular: RRR  Respiratory: CTABL  Abdomen: Soft/ND/NT, positive BS  Musculoskeletal: No Edema  Neuro: aaox3  Data Reviewed: Basic Metabolic Panel:  Recent Labs Lab 06/28/15 2053 07/01/15 2346 07/02/15 0600 07/03/15 0518  NA 138 133* 135 135  K 3.9 3.4* 3.4* 3.3*  CL 103 97* 103 103  CO2 GLUCOSE 115* 111* 99 123*  BUN 12 30* 23* 13  CREATININE 1.11 1.80* 1.27* 1.00  CALCIUM 10.0 9.7 8.6* 8.5*  MG  --   --   --  2.1   Liver Function Tests:  Recent Labs Lab 06/28/15 2053 07/02/15 0600  AST 36 26  ALT 43 27  ALKPHOS 71 57  BILITOT 0.8 1.1  PROT 8.1 7.0  ALBUMIN 4.3 4.0   No results for input(s): LIPASE, AMYLASE in the last 168 hours. No results for input(s): AMMONIA in the last 168 hours. CBC:  Recent Labs Lab 06/28/15 2053  07/01/15 2346 07/02/15 0600 07/02/15 1215  WBC 10.6* 10.5 10.5 7.0  NEUTROABS  --  6.1 4.9 3.8  HGB 15.1 16.2 13.4 13.1  HCT 43.1 46.2 39.1 39.1  MCV 87.2 85.9 88.1 88.3  PLT 188 180 158 160   Cardiac Enzymes:   No results for input(s): CKTOTAL, CKMB, CKMBINDEX, TROPONINI in the last 168 hours. BNP (last 3 results) No results for input(s): BNP in the last 8760 hours.  ProBNP (last 3 results) No results for input(s): PROBNP in the last 8760 hours.  CBG: No results for input(s): GLUCAP in the last 168 hours.  Recent Results (from the past 240 hour(s))  Surgical pcr screen     Status: Abnormal   Collection Time: 07/03/15 11:03 AM  Result Value Ref Range Status   MRSA, PCR NEGATIVE NEGATIVE Final   Staphylococcus aureus POSITIVE (A) NEGATIVE Final    Comment:        The Xpert SA Assay (FDA approved for NASAL specimens in patients over 31 years of age), is one component of a comprehensive surveillance program.  Test performance has been validated by [facility] for patients greater than or equal to 48 year old. It is not intended to diagnose infection nor to guide or monitor treatment.      Studies: No results found.  Scheduled Meds: . [START ON 07/04/2015]  cefoTEtan (CEFOTAN) IV  2 g Intravenous Q12H  . docusate sodium  100 mg Oral BID  . hydrocortisone-pramoxine  1 applicator Rectal BID  . polyethylene glycol  17 g Oral Daily  . psyllium  1 packet Oral Daily    Continuous Infusions: . sodium chloride 100 mL/hr (07/03/15 1715)  . ropivacaine (PF) 2 mg/ml (0.2%)       Time spent:  Toma Erichsen MD, PhD  [facility] Pager [phone]. If 7PM-7AM, please contact night-coverage at [email or web address], password [facility] 07/03/2015, 7:02 PM  LOS: 1 day

## 2015-07-03 NOTE — Transfer of Care (Signed)
Immediate Anesthesia Transfer of Care Note  Patient: Bruce Shelton  Procedure(s) Performed: Procedure(s): Exam under anesthesia, HEMORRHOIDECTOMY (N/A)  Patient Location: PACU  Anesthesia Type:GA combined with regional for post-op pain  Level of Consciousness:  sedated, patient cooperative and responds to stimulation  Airway & Oxygen Therapy:Patient Spontanous Breathing and Patient connected to face mask oxgen  Post-op Assessment:  Report given to PACU RN and Post -op Vital signs reviewed and stable  Post vital signs:  Reviewed and stable  Last Vitals:  Filed Vitals:   07/03/15 1223  BP: 168/109  Pulse: 59  Temp: 36.8 C  Resp: 18    Complications: No apparent anesthesia complications

## 2015-07-03 NOTE — Anesthesia Postprocedure Evaluation (Signed)
Anesthesia Post Note  Patient: Bruce Shelton  Procedure(s) Performed: Procedure(s) (LRB): Exam under anesthesia, HEMORRHOIDECTOMY (N/A)  Anesthesia type: General with epidural adjunct.  Patient location: PACU  Post pain: Pain level controlled and Adequate analgesia  Post assessment: Post-op Vital signs reviewed, Patient's Cardiovascular Status Stable, Respiratory Function Stable, Patent Airway and Pain level controlled  Last Vitals:  Filed Vitals:   07/03/15 1615  BP: 145/75  Pulse: 92  Temp:   Resp: 21    Post vital signs: Reviewed and stable  Level of consciousness: awake, alert  and oriented  Complications: No apparent anesthesia complications

## 2015-07-03 NOTE — Anesthesia Procedure Notes (Addendum)
Epidural Patient location during procedure: pre-op Start time: 07/03/2015 1:13 PM End time: 07/03/2015 1:25 PM  Staffing Anesthesiologist: HODIERNE, ADAM Performed by: anesthesiologist   Preanesthetic Checklist Completed: patient identified, site marked, surgical consent, pre-op evaluation, timeout performed, IV checked, risks and benefits discussed, monitors and equipment checked and post-op pain management  Epidural Patient position: left lateral decubitus Prep: Betadine and site prepped and draped Patient monitoring: heart rate, cardiac monitor, continuous pulse ox and blood pressure Approach: midline Location: L5-S1 Injection technique: LOR air  Needle:  Needle type: Tuohy  Needle gauge: 16 G Needle length: 9 cm Needle insertion depth: 6 cm Catheter type: closed end flexible Catheter size: 20 Guage Catheter at skin depth: 12 cm Test dose: negative and 1.5% lidocaine with Epi 1:200 K  Assessment Events: blood not aspirated, injection not painful, no injection resistance, negative IV test and no paresthesia  Additional Notes Pt tolerated the procedure well.Reason for block:post-op pain management  Procedure Name: Intubation Date/Time: 07/03/2015 2:18 PM Performed by: Ludwig Lean Pre-anesthesia Checklist: Patient identified, Emergency Drugs available, Suction available and Patient being monitored Patient Re-evaluated:Patient Re-evaluated prior to inductionOxygen Delivery Method: Circle System Utilized Preoxygenation: Pre-oxygenation with 100% oxygen Intubation Type: IV induction Ventilation: Mask ventilation without difficulty Laryngoscope Size: Mac and 4 Grade View: Grade I Tube type: Oral Tube size: 8.0 mm Number of attempts: 1 Airway Equipment and Method: Oral airway Placement Confirmation: ETT inserted through vocal cords under direct vision,  positive ETCO2 and breath sounds checked- equal and bilateral Secured at: 21 cm Tube secured with: Tape Dental  Injury: Teeth and Oropharynx as per pre-operative assessment

## 2015-07-03 NOTE — Progress Notes (Signed)
Subjective: He is well-known to [facility].  He is on maximal medical therapy for severe, thrombosed external hemorrhoids and continue to have severe pain and swelling.  Have loose BMs.  CT reviewed.  Objective: Vital signs in last 24 hours: Temp:  [98 F (36.7 C)-98.4 F (36.9 C)] 98.3 F (36.8 C) (08/22 0647) Pulse Rate:  [57-63] 63 (08/22 0647) Resp:  [18-20] 18 (08/22 0647) BP: (135-143)/(81-94) 143/94 mmHg (08/22 0647) SpO2:  [99 %-100 %] 100 % (08/22 0647) Last BM Date: 07/02/15  Intake/Output from previous day: 08/21 0701 - 08/22 0700 In: 2600 [P.O.:1400; I.V.:1200] Out: -  Intake/Output this shift: Total I/O In: 1300 [I.V.:1300] Out: -   PE: General- In NAD Anorectal-severe diffuse perianal swelling and tenderness unimproved from when I saw him in the office last week.  Lab Results:   Recent Labs  07/02/15 0600 07/02/15 1215  WBC 10.5 7.0  HGB 13.4 13.1  HCT 39.1 39.1  PLT 158 160   BMET  Recent Labs  07/02/15 0600 07/03/15 0518  NA 135 135  K 3.4* 3.3*  CL 103 103  CO2 25 26  GLUCOSE 99 123*  BUN 23* 13  CREATININE 1.27* 1.00  CALCIUM 8.6* 8.5*   PT/INR  Recent Labs  07/02/15 0600  LABPROT 14.6  INR 1.12   Comprehensive Metabolic Panel:    Component Value Date/Time   NA 135 07/03/2015 0518   NA 135 07/02/2015 0600   K 3.3* 07/03/2015 0518   K 3.4* 07/02/2015 0600   CL 103 07/03/2015 0518   CL 103 07/02/2015 0600   CO2 26 07/03/2015 0518   CO2 25 07/02/2015 0600   BUN 13 07/03/2015 0518   BUN 23* 07/02/2015 0600   CREATININE 1.00 07/03/2015 0518   CREATININE 1.27* 07/02/2015 0600   GLUCOSE 123* 07/03/2015 0518   GLUCOSE 99 07/02/2015 0600   CALCIUM 8.5* 07/03/2015 0518   CALCIUM 8.6* 07/02/2015 0600   AST 26 07/02/2015 0600   AST 36 06/28/2015 2053   ALT 27 07/02/2015 0600   ALT 43 06/28/2015 2053   ALKPHOS 57 07/02/2015 0600   ALKPHOS 71 06/28/2015 2053   BILITOT 1.1 07/02/2015 0600   BILITOT 0.8 06/28/2015 2053   PROT 7.0  07/02/2015 0600   PROT 8.1 06/28/2015 2053   ALBUMIN 4.0 07/02/2015 0600   ALBUMIN 4.3 06/28/2015 2053     Studies/Results: Ct Abdomen Pelvis W Contrast  07/02/2015   CLINICAL DATA:  Rectal pain and drainage 4 days. Severe hemorrhoids with concern for abscess.  EXAM: CT ABDOMEN AND PELVIS WITH CONTRAST  TECHNIQUE: Multidetector CT imaging of the abdomen and pelvis was performed using the standard protocol following bolus administration of intravenous contrast.  CONTRAST:  50mL OMNIPAQUE IOHEXOL 300 MG/ML SOLN, OMNIPAQUE IOHEXOL 300 MG/ML SOLN  COMPARISON:  None.  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  LOWER CHEST: Fatty Bochdalek's hernia on the left.  ABDOMEN/PELVIS:  Liver: No focal abnormality.  Biliary: No evidence of biliary obstruction or stone.  Pancreas: Unremarkable.  Spleen: Unremarkable.  Adrenals: Unremarkable.  Kidneys and ureters: No hydronephrosis or stone.  Bladder: Unremarkable.  Reproductive: No pathologic findings.  Bowel: Lobulated thickening at the anus correlating with history of hemorrhoids. By imaging, rectal prolapse, mass or verrucae would have the same appearance. There is no fluid collection or inflammatory change. No soft tissue gas. No bowel obstruction. No appendicitis (appendix in shallow right inguinal hernia).  Retroperitoneum: No mass or adenopathy.  Peritoneum: No ascites  or pneumoperitoneum.  Vascular: No acute abnormality.  OSSEOUS: No acute abnormalities.  IMPRESSION: 1. Anal mass correlating with history of severe hemorrhoids, prolapsed appearing. No abscess. 2. Shallow right inguinal hernia containing appendix.   Electronically Signed   By: Marnee Spring M.D.   On: 07/02/2015 03:07    Anti-infectives: Anti-infectives    None      Assessment Principal Problem:  Severe persistent thrombosed external hemorrhoids that have not responded to maximum medical therapy; no evidence of anal sepsis.   AKI (acute kidney  injury)-resolved     LOS: 1 day   Plan: To OR today for EUA and hemorrhoidectomy.  Procedure, rationale, and risks discussed with him.  Risks included but are not limited to bleeding, infection, anal stenosis requiring dilatations, incontinence, need for repeat operations, anesthesia.  He seems to understand and agrees with the plan.   Clairessa Boulet Shela Commons 07/03/2015

## 2015-07-04 ENCOUNTER — Inpatient Hospital Stay (HOSPITAL_COMMUNITY): Payer: BLUE CROSS/BLUE SHIELD

## 2015-07-04 ENCOUNTER — Encounter (HOSPITAL_COMMUNITY): Payer: Self-pay | Admitting: General Surgery

## 2015-07-04 DIAGNOSIS — R5082 Postprocedural fever: Secondary | ICD-10-CM

## 2015-07-04 DIAGNOSIS — R109 Unspecified abdominal pain: Secondary | ICD-10-CM

## 2015-07-04 MED ORDER — DEXTROSE 5 % IV SOLN
1.0000 g | Freq: Two times a day (BID) | INTRAVENOUS | Status: DC
  Administered 2015-07-04 – 2015-07-06 (×4): 1 g via INTRAVENOUS
  Filled 2015-07-04 (×5): qty 1

## 2015-07-04 NOTE — Progress Notes (Signed)
POD1 s/p complete external hemorrhoidectomy. Patient reports good pain control with epidural.  Moving bilateral lower extremities. OOB to chair today with assistance. Patient satisfied with epidural analgesia.  Plan to remove epidural tomorrow per discussion with surgeon.  Arrie Aran, MD Anesthesiology

## 2015-07-04 NOTE — Addendum Note (Signed)
Addendum  created 07/04/15 1610 by Cecile Hearing, MD   Modules edited: Clinical Notes   Clinical Notes:  File: [id number]; Pend: [id number]

## 2015-07-04 NOTE — Progress Notes (Signed)
1 Day Post-Op  Subjective: Not having any pain.  Left leg feels numb  Objective: Vital signs in last 24 hours: Temp:  [97.3 F (36.3 C)-100.5 F (38.1 C)] 100.5 F (38.1 C) (08/23 0526) Pulse Rate:  [59-96] 72 (08/23 0526) Resp:  [14-21] 16 (08/23 0526) BP: (109-168)/(64-115) 118/65 mmHg (08/23 0526) SpO2:  [96 %-100 %] 99 % (08/23 0526) Weight:  [81.647 kg (180 lb)] 81.647 kg (180 lb) (08/22 1715) Last BM Date: 07/02/15  Intake/Output from previous day: 08/22 0701 - 08/23 0700 In: 3000 [I.V.:3000] Out: 4850 [Urine:4850] Intake/Output this shift:    PE: General- In NAD Anorectal-dressing dry  Lab Results:   Recent Labs  07/02/15 0600 07/02/15 1215  WBC 10.5 7.0  HGB 13.4 13.1  HCT 39.1 39.1  PLT 158 160   BMET  Recent Labs  07/02/15 0600 07/03/15 0518  NA 135 135  K 3.4* 3.3*  CL 103 103  CO2 25 26  GLUCOSE 99 123*  BUN 23* 13  CREATININE 1.27* 1.00  CALCIUM 8.6* 8.5*   PT/INR  Recent Labs  07/02/15 0600  LABPROT 14.6  INR 1.12   Comprehensive Metabolic Panel:    Component Value Date/Time   NA 135 07/03/2015 0518   NA 135 07/02/2015 0600   K 3.3* 07/03/2015 0518   K 3.4* 07/02/2015 0600   CL 103 07/03/2015 0518   CL 103 07/02/2015 0600   CO2 26 07/03/2015 0518   CO2 25 07/02/2015 0600   BUN 13 07/03/2015 0518   BUN 23* 07/02/2015 0600   CREATININE 1.00 07/03/2015 0518   CREATININE 1.27* 07/02/2015 0600   GLUCOSE 123* 07/03/2015 0518   GLUCOSE 99 07/02/2015 0600   CALCIUM 8.5* 07/03/2015 0518   CALCIUM 8.6* 07/02/2015 0600   AST 26 07/02/2015 0600   AST 36 06/28/2015 2053   ALT 27 07/02/2015 0600   ALT 43 06/28/2015 2053   ALKPHOS 57 07/02/2015 0600   ALKPHOS 71 06/28/2015 2053   BILITOT 1.1 07/02/2015 0600   BILITOT 0.8 06/28/2015 2053   PROT 7.0 07/02/2015 0600   PROT 8.1 06/28/2015 2053   ALBUMIN 4.0 07/02/2015 0600   ALBUMIN 4.3 06/28/2015 2053     Studies/Results: No results  found.  Anti-infectives: Anti-infectives    Start     Dose/Rate Route Frequency Ordered Stop   07/04/15 0200  cefoTEtan (CEFOTAN) 2 g in dextrose 5 % 50 mL IVPB     2 g 100 mL/hr over 30 Minutes Intravenous Every 12 hours 07/03/15 1732 07/04/15 2159   07/03/15 1100  cefoTEtan (CEFOTAN) 2 g in dextrose 5 % 50 mL IVPB     2 g 100 mL/hr over 30 Minutes Intravenous  Once 07/03/15 1015 07/03/15 1421      Assessment Severe, thrombosed external hemorrhoids that failed medical management s/p complete external hemorrhoidectomy-good postop pain control with epidural   LOS: 2 days   Plan: Remove epidural and foley tomorrow.  OOB to chair.  Continue abxs today.   Adoria Kawamoto Shela Commons 07/04/2015

## 2015-07-04 NOTE — Progress Notes (Signed)
PROGRESS NOTE  Bruce Shelton ZOX:[id number] DOB: 1986/09/01 DOA: 07/01/2015 PCP: Karie Chimera, MD  HPI/Recap of past 24 hours:  Pod #1, rectal pain controlled by epidural. Reported not able to pass gas, ab pain, vomited once. fever  Assessment/Plan: Principal Problem:   AKI (acute kidney injury) Active Problems:   Dehydration   Hemorrhoid  Ab pain /vomiting: ?ileus, kub pending. Change diet to clears for now.  External hemorrhoid: s/p hemorrhoidectomy 8/22, pain controlled by epidural. Appreciate surgery /anesthsiology input.  Fever: continue abx  ARF: resolved with hydration. Continue hydration.  Code Status: full   Family Communication: patient   Disposition Plan: home when medically stable   Consultants:  General surgery  Procedures: Complete external hemorrhoidectomy(Open technique) 8/22 by Dr. Abbey Chatters  Antibiotics:  perioeprative cefotan, continued post op due to fever   Objective: BP 100/52 mmHg  Pulse 72  Temp(Src) 100.6 F (38.1 C) (Oral)  Resp 16  Ht  (1.854 m)  Wt 180 lb (81.647 kg)  BMI 23.75 kg/m2  SpO2 100%  Intake/Output Summary (Last 24 hours) at 07/04/15 1557 Last data filed at 07/04/15 1413  Gross per 24 hour  Intake    790 ml  Output   5750 ml  Net  -4960 ml   Filed Weights   07/01/15 2317 07/03/15 1715  Weight: 180 lb (81.647 kg) 180 lb (81.647 kg)    Exam:   General:  NAD  Cardiovascular: RRR  Respiratory: CTABL  Abdomen: mild mid ab tenderness, no rebound, no guarding, NT, decreased BS  Musculoskeletal: No Edema  Neuro: aaox3  Data Reviewed: Basic Metabolic Panel:  Recent Labs Lab 06/28/15 2053 07/01/15 2346 07/02/15 0600 07/03/15 0518  NA 138 133* 135 135  K 3.9 3.4* 3.4* 3.3*  CL 103 97* 103 103  CO2 GLUCOSE 115* 111* 99 123*  BUN 12 30* 23* 13  CREATININE 1.11 1.80* 1.27* 1.00  CALCIUM 10.0 9.7 8.6* 8.5*  MG  --   --   --  2.1   Liver Function Tests:  Recent Labs Lab  06/28/15 2053 07/02/15 0600  AST 36 26  ALT 43 27  ALKPHOS 71 57  BILITOT 0.8 1.1  PROT 8.1 7.0  ALBUMIN 4.3 4.0   No results for input(s): LIPASE, AMYLASE in the last 168 hours. No results for input(s): AMMONIA in the last 168 hours. CBC:  Recent Labs Lab 06/28/15 2053 07/01/15 2346 07/02/15 0600 07/02/15 1215  WBC 10.6* 10.5 10.5 7.0  NEUTROABS  --  6.1 4.9 3.8  HGB 15.1 16.2 13.4 13.1  HCT 43.1 46.2 39.1 39.1  MCV 87.2 85.9 88.1 88.3  PLT 188 180 158 160   Cardiac Enzymes:   No results for input(s): CKTOTAL, CKMB, CKMBINDEX, TROPONINI in the last 168 hours. BNP (last 3 results) No results for input(s): BNP in the last 8760 hours.  ProBNP (last 3 results) No results for input(s): PROBNP in the last 8760 hours.  CBG: No results for input(s): GLUCAP in the last 168 hours.  Recent Results (from the past 240 hour(s))  Surgical pcr screen     Status: Abnormal   Collection Time: 07/03/15 11:03 AM  Result Value Ref Range Status   MRSA, PCR NEGATIVE NEGATIVE Final   Staphylococcus aureus POSITIVE (A) NEGATIVE Final    Comment:        The Xpert SA Assay (FDA approved for NASAL specimens in patients over 75 years of age), is one component of a comprehensive  surveillance program.  Test performance has been validated by [facility] for patients greater than or equal to 57 year old. It is not intended to diagnose infection nor to guide or monitor treatment.      Studies: No results found.  Scheduled Meds: . cefoTEtan (CEFOTAN) IV  1 g Intravenous Q12H  . docusate sodium  100 mg Oral BID  . hydrocortisone-pramoxine  1 applicator Rectal BID  . polyethylene glycol  17 g Oral Daily  . psyllium  1 packet Oral Daily    Continuous Infusions: . sodium chloride 75 mL/hr at 07/04/15 1529  . ropivacaine (PF) 2 mg/ml (0.2%)       Time spent:  Bruce Fehring MD, PhD  [facility] Pager [phone]. If 7PM-7AM, please contact night-coverage at [email or web address],  password [facility] 07/04/2015, 3:57 PM  LOS: 2 days

## 2015-07-05 DIAGNOSIS — N179 Acute kidney failure, unspecified: Secondary | ICD-10-CM

## 2015-07-05 LAB — CBC
HEMATOCRIT: 34.6 % — AB (ref 39.0–52.0)
Hemoglobin: 11.6 g/dL — ABNORMAL LOW (ref 13.0–17.0)
MCH: 29.2 pg (ref 26.0–34.0)
MCHC: 33.5 g/dL (ref 30.0–36.0)
MCV: 87.2 fL (ref 78.0–100.0)
Platelets: 150 10*3/uL (ref 150–400)
RBC: 3.97 MIL/uL — ABNORMAL LOW (ref 4.22–5.81)
RDW: 12.2 % (ref 11.5–15.5)
WBC: 10.7 10*3/uL — ABNORMAL HIGH (ref 4.0–10.5)

## 2015-07-05 LAB — BASIC METABOLIC PANEL
Anion gap: 6 (ref 5–15)
BUN: 7 mg/dL (ref 6–20)
CALCIUM: 8.6 mg/dL — AB (ref 8.9–10.3)
CHLORIDE: 104 mmol/L (ref 101–111)
CO2: 29 mmol/L (ref 22–32)
CREATININE: 0.95 mg/dL (ref 0.61–1.24)
GFR calc Af Amer: >60 mL/min (ref >60)
GFR calc non Af Amer: >60 mL/min (ref >60)
GLUCOSE: 99 mg/dL (ref 65–99)
Potassium: 3.5 mmol/L (ref 3.5–5.1)
Sodium: 139 mmol/L (ref 135–145)

## 2015-07-05 MED ORDER — ONDANSETRON HCL 4 MG PO TABS
4.0000 mg | ORAL_TABLET | Freq: Four times a day (QID) | ORAL | Status: DC | PRN
  Administered 2015-07-06: 4 mg via ORAL
  Filled 2015-07-05: qty 1

## 2015-07-05 MED ORDER — MORPHINE SULFATE (PF) 2 MG/ML IV SOLN
2.0000 mg | INTRAVENOUS | Status: DC | PRN
Start: 2015-07-05 — End: 2015-07-06
  Administered 2015-07-05: 2 mg via INTRAVENOUS
  Filled 2015-07-05: qty 1

## 2015-07-05 MED ORDER — OXYCODONE HCL 5 MG PO TABS
5.0000 mg | ORAL_TABLET | ORAL | Status: DC | PRN
  Administered 2015-07-05 – 2015-07-06 (×2): 10 mg via ORAL
  Filled 2015-07-05 (×3): qty 2

## 2015-07-05 MED ORDER — ONDANSETRON HCL 4 MG/2ML IJ SOLN
4.0000 mg | INTRAMUSCULAR | Status: DC | PRN
  Administered 2015-07-05: 4 mg via INTRAVENOUS
  Filled 2015-07-05: qty 2

## 2015-07-05 NOTE — Progress Notes (Signed)
Called by Dr. Abbey Chatters to remove epidural. POD #2 s/p hemorrhoidectomy.  On no anticoagulants.  No back pain. Moving both feet. Tingling left foot.   Site: Epidural in place. Site non-red, non-tender. Epidural pulled. Tip intact.  Band Aid applied. [facility] RN informed.

## 2015-07-05 NOTE — Progress Notes (Signed)
2 Days Post-Op  Subjective: He is still pretty miserable.  Has gas he cannot pass, no flatus, he got up once and started vomiting.    Objective: Vital signs in last 24 hours: Temp:  [98.2 F (36.8 C)-100.6 F (38.1 C)] 98.7 F (37.1 C) (08/24 0449) Pulse Rate:  [56-72] 56 (08/24 0449) Resp:  [16] 16 (08/24 0449) BP: (100-126)/(50-70) 113/69 mmHg (08/24 0449) SpO2:  [99 %-100 %] 100 % (08/24 0449) Last BM Date: 07/02/15 530 PO  Diet: clears 1600 urine Low grade fever at 11PM yesterday, none recorded since that time. VSS Labs OK Intake/Output from previous day: 08/23 0701 - 08/24 0700 In: 1780 [P.O.:530; I.V.:1200; IV Piggyback:50] Out: 1600 [Urine:1600] Intake/Output this shift:    General appearance: alert, cooperative and no distress Resp: clear to auscultation bilaterally GI: soft, non-tender; bowel sounds normal; no masses,  no organomegaly Skin: dressing still intact  Lab Results:   Recent Labs  07/02/15 1215 07/05/15 0443  WBC 7.0 10.7*  HGB 13.1 11.6*  HCT 39.1 34.6*  PLT 160 150    BMET  Recent Labs  07/03/15 0518 07/05/15 0443  NA 135 139  K 3.3* 3.5  CL 103 104  CO2 26 29  GLUCOSE 123* 99  BUN 13 7  CREATININE 1.00 0.95  CALCIUM 8.5* 8.6*   PT/INR No results for input(s): LABPROT, INR in the last 72 hours.   Recent Labs Lab 06/28/15 2053 07/02/15 0600  AST 36 26  ALT 43 27  ALKPHOS 71 57  BILITOT 0.8 1.1  PROT 8.1 7.0  ALBUMIN 4.3 4.0     Lipase     Component Value Date/Time   LIPASE 17 12/20/2014 0018     Studies/Results: Dg Abd Portable 1v  07/04/2015   CLINICAL DATA:  Acute lower abdominal pain.  EXAM: PORTABLE ABDOMEN - 1 VIEW  COMPARISON:  None.  FINDINGS: No small bowel dilatation is noted. Phleboliths are noted in the pelvis. Residual contrast is seen in the right and left colon. Mildly dilated air-filled colon is noted suggesting colonic ileus.  IMPRESSION: Mildly dilated air-filled colon is noted suggesting colonic  ileus. No definite evidence of bowel obstruction is noted.   Electronically Signed   By: Lupita Raider, M.D.   On: 07/04/2015 16:23    Medications: . cefoTEtan (CEFOTAN) IV  1 g Intravenous Q12H  . docusate sodium  100 mg Oral BID  . hydrocortisone-pramoxine  1 applicator Rectal BID  . polyethylene glycol  17 g Oral Daily  . psyllium  1 packet Oral Daily   . sodium chloride 75 mL/hr at 07/04/15 1529  . ropivacaine (PF) 2 mg/ml (0.2%)      Assessment/Plan Persistent, severe thrombosed external hemorrhoids S/p Examination under anesthesia. Anal block with Exparel. Complete external hemorrhoidectomy(Open technique), 07/03/15, Dr. Abbey Chatters. Antibiotics:  Cefotetan day 3 DVT: SCD/no anticoagulant while epidural is in place.    Plan:  I will wait till I talk with Dr. Abbey Chatters, but plans have been to remove epidural, and foley.  Dressing changes.  No heparin due to epidural.        LOS: 3 days    Bruce Shelton 07/05/2015

## 2015-07-05 NOTE — Addendum Note (Signed)
Addendum  created 07/05/15 1043 by Sherrian Divers, MD   Modules edited: Clinical Notes   Clinical Notes:  File: [id number]; Pend: [id number]

## 2015-07-05 NOTE — Progress Notes (Signed)
PROGRESS NOTE  Bruce Shelton ZOX:[id number] DOB: 04-23-1986 DOA: 07/01/2015 PCP: Karie Chimera, MD   HPI: 29 yo M admitted on 8/21 with severe thrombosed external hemorrhoids s/p complete external hemorrhoidectomy on 8/22.   Subjective / 24 H Interval events - no chest pain, shortness of breath, no abdominal pain - endorses nausea   Assessment/Plan: Principal Problem:   AKI (acute kidney injury) Active Problems:   Dehydration   Hemorrhoid  Ab pain / vomiting - colonic ileus on abdominal film - surgery following   External hemorrhoid - s/p hemorrhoidectomy 8/22, pain controlled by epidural, to be removed today - Appreciate surgery /anesthsiology input.  Fever - no obvious source other than #2, post op - continue abx  AKI - resolved with hydration. Continue hydration.   Diet: Diet clear liquid Room service appropriate?: Yes; Fluid consistency:: Thin Fluids: NS DVT Prophylaxis: SCD  Code Status: Prior Family Communication: no family bedside  Disposition Plan: remain inpatient  Consultants:  General Surgery   Anesthesiology   Procedures:  s/p complete external hemorrhoidectomy   Antibiotics  Anti-infectives    Start     Dose/Rate Route Frequency Ordered Stop   07/04/15 2200  cefoTEtan (CEFOTAN) 1 g in dextrose 5 % 50 mL IVPB     1 g 100 mL/hr over 30 Minutes Intravenous Every 12 hours 07/04/15 1551     07/04/15 0200  cefoTEtan (CEFOTAN) 2 g in dextrose 5 % 50 mL IVPB     2 g 100 mL/hr over 30 Minutes Intravenous Every 12 hours 07/03/15 1732 07/04/15 1128   07/03/15 1100  cefoTEtan (CEFOTAN) 2 g in dextrose 5 % 50 mL IVPB     2 g 100 mL/hr over 30 Minutes Intravenous  Once 07/03/15 1015 07/03/15 1421      Studies  Dg Abd Portable 1v  07/04/2015   CLINICAL DATA:  Acute lower abdominal pain.  EXAM: PORTABLE ABDOMEN - 1 VIEW  COMPARISON:  None.  FINDINGS: No small bowel dilatation is noted. Phleboliths are noted in the pelvis. Residual contrast is  seen in the right and left colon. Mildly dilated air-filled colon is noted suggesting colonic ileus.  IMPRESSION: Mildly dilated air-filled colon is noted suggesting colonic ileus. No definite evidence of bowel obstruction is noted.   Electronically Signed   By: Lupita Raider, M.D.   On: 07/04/2015 16:23   Objective  Filed Vitals:   07/04/15 1553 07/04/15 2218 07/05/15 0240 07/05/15 0449  BP:  109/50 114/57 113/69  Pulse:  59 60 56  Temp:  98.2 F (36.8 C) 99.1 F (37.3 C) 98.7 F (37.1 C)  TempSrc:  Oral Oral Oral  Resp: 16 16 16 16   Height:      Weight:      SpO2: 99% 100% 99% 100%    Intake/Output Summary (Last 24 hours) at 07/05/15 1331 Last data filed at 07/05/15 1326  Gross per 24 hour  Intake   2510 ml  Output   2000 ml  Net    510 ml   Filed Weights   07/01/15 2317 07/03/15 1715  Weight: 81.647 kg (180 lb) 81.647 kg (180 lb)   Exam:  GENERAL: NAD  HEENT: head NCAT  NECK: Supple.   LUNGS: Clear to auscultation. No wheezing or crackleso  HEART: Regular rate and rhythm without murmur. 2+ pulses, no JVD, no peripheral edema  ABDOMEN: Soft, nontender, and nondistended. Positive bowel sounds.  EXTREMITIES: Without any cyanosis, clubbing, rash, lesions or edema.  NEUROLOGIC: Alert and  oriented x3. Strength 5/5 in all 4.  Data Reviewed: Basic Metabolic Panel:  Recent Labs Lab 06/28/15 2053 07/01/15 2346 07/02/15 0600 07/03/15 0518 07/05/15 0443  NA 138 133* 135 135 139  K 3.9 3.4* 3.4* 3.3* 3.5  CL 103 97* 103 103 104  CO2 GLUCOSE 115* 111* 99 123* 99  BUN 12 30* 23* 13 7  CREATININE 1.11 1.80* 1.27* 1.00 0.95  CALCIUM 10.0 9.7 8.6* 8.5* 8.6*  MG  --   --   --  2.1  --    Liver Function Tests:  Recent Labs Lab 06/28/15 2053 07/02/15 0600  AST 36 26  ALT 43 27  ALKPHOS 71 57  BILITOT 0.8 1.1  PROT 8.1 7.0  ALBUMIN 4.3 4.0   CBC:  Recent Labs Lab 06/28/15 2053 07/01/15 2346 07/02/15 0600 07/02/15 1215  07/05/15 0443  WBC 10.6* 10.5 10.5 7.0 10.7*  NEUTROABS  --  6.1 4.9 3.8  --   HGB 15.1 16.2 13.4 13.1 11.6*  HCT 43.1 46.2 39.1 39.1 34.6*  MCV 87.2 85.9 88.1 88.3 87.2  PLT 188 180 158 160 150    Recent Results (from the past 240 hour(s))  Surgical pcr screen     Status: Abnormal   Collection Time: 07/03/15 11:03 AM  Result Value Ref Range Status   MRSA, PCR NEGATIVE NEGATIVE Final   Staphylococcus aureus POSITIVE (A) NEGATIVE Final    Comment:        The Xpert SA Assay (FDA approved for NASAL specimens in patients over 78 years of age), is one component of a comprehensive surveillance program.  Test performance has been validated by [facility] for patients greater than or equal to 47 year old. It is not intended to diagnose infection nor to guide or monitor treatment.      Scheduled Meds: . cefoTEtan (CEFOTAN) IV  1 g Intravenous Q12H  . docusate sodium  100 mg Oral BID  . hydrocortisone-pramoxine  1 applicator Rectal BID  . polyethylene glycol  17 g Oral Daily  . psyllium  1 packet Oral Daily   Continuous Infusions: . sodium chloride 75 mL/hr at 07/04/15 1529  . ropivacaine (PF) 2 mg/ml (0.2%)       Pamella Pert, MD [facility] Pager [phone]. If 7 PM - 7 AM, please contact night-coverage at [email or web address], password [facility] 07/05/2015, 1:31 PM  LOS: 3 days

## 2015-07-05 NOTE — Progress Notes (Signed)
Utilization Review complete 

## 2015-07-06 DIAGNOSIS — E860 Dehydration: Secondary | ICD-10-CM

## 2015-07-06 MED ORDER — DOCUSATE SODIUM 100 MG PO CAPS: 100.0000 mg | ORAL_CAPSULE | Freq: Two times a day (BID) | ORAL | Status: DC

## 2015-07-06 MED ORDER — PSYLLIUM 95 % PO PACK: 1.0000 | PACK | Freq: Every day | ORAL | Status: DC

## 2015-07-06 MED ORDER — DIAZEPAM 5 MG PO TABS
2.5000 mg | ORAL_TABLET | Freq: Once | ORAL | Status: AC
  Administered 2015-07-06: 2.5 mg via ORAL
  Filled 2015-07-06: qty 1

## 2015-07-06 MED ORDER — DIAZEPAM 5 MG PO TABS: 2.5000 mg | ORAL_TABLET | Freq: Four times a day (QID) | ORAL | Status: DC | PRN

## 2015-07-06 MED ORDER — OXYCODONE HCL 5 MG PO TABS: 5.0000 mg | ORAL_TABLET | ORAL | Status: DC | PRN

## 2015-07-06 MED ORDER — METRONIDAZOLE 500 MG PO TABS: 500.0000 mg | ORAL_TABLET | Freq: Three times a day (TID) | ORAL | Status: DC

## 2015-07-06 MED ORDER — AMOXICILLIN-POT CLAVULANATE 875-125 MG PO TABS: 1.0000 | ORAL_TABLET | Freq: Two times a day (BID) | ORAL | Status: DC

## 2015-07-06 MED ORDER — POLYETHYLENE GLYCOL 3350 17 G PO PACK: 17.0000 g | PACK | Freq: Every day | ORAL | Status: DC

## 2015-07-06 MED ORDER — CIPROFLOXACIN HCL 500 MG PO TABS: 500.0000 mg | ORAL_TABLET | Freq: Two times a day (BID) | ORAL | Status: DC

## 2015-07-06 NOTE — Progress Notes (Signed)
Patient ID: Bruce Shelton, male   DOB: [birth year], 29 y.o.   MRN: [id number]     [facility]      [address] [place], [place] [address]    Phone: [phone] FAX: [phone]     Subjective: Bruce Shelton.  Had 2 BMs. Tolerated solids.  No dysuria.  Temp max 99.9.  No clear source, no symptoms.    Objective:  Vital signs:  Filed Vitals:   07/05/15 1510 07/05/15 2103 07/06/15 0036 07/06/15 0440  BP: 130/81 150/91 135/79 128/83  Pulse: 58 70 59 62  Temp: 99.9 F (37.7 C) 99.1 F (37.3 C) 98.7 F (37.1 C) 97 F (36.1 C)  TempSrc: Oral Oral Oral Oral  Resp: _0 Height:      Weight:      SpO2: 99% 100% 99% 97%    Last BM Date: 07/02/15  Intake/Output   Yesterday:  08/24 0701 - 08/25 0700 In: 6606 [P.O.:600; I.V.:1725; IV Piggyback:100] Out: 3016 [Urine:1275] This shift:    I/O last 3 completed shifts: In: 0109 [P.O.:720; I.V.:2625; IV Piggyback:150] Out: 1975 [NATFT:7322]    Physical Exam: General: Pt awake/alert/oriented x4 in no acute distress GU: soft, tender, sanguinous output. No areas or fluctuance or purulent drainage.    Problem List:   Principal Problem:   AKI (acute kidney injury) Active Problems:   Dehydration   Hemorrhoid    Results:   Labs: Results for orders placed or performed during the hospital encounter of 07/01/15 (from the past 48 hour(s))  CBC     Status: Abnormal   Collection Time: 07/05/15  4:43 AM  Result Value Ref Range   WBC 10.7 (H) 4.0 - 10.5 K/uL   RBC 3.97 (L) 4.22 - 5.81 MIL/uL   Hemoglobin 11.6 (L) 13.0 - 17.0 g/dL   HCT 34.6 (L) 39.0 - 52.0 %   MCV 87.2 78.0 - 100.0 fL   MCH 29.2 26.0 - 34.0 pg   MCHC 33.5 30.0 - 36.0 g/dL   RDW 12.2 11.5 - 15.5 %   Platelets 150 150 - 400 K/uL  Basic metabolic panel     Status: Abnormal   Collection Time: 07/05/15  4:43 AM  Result Value Ref Range   Sodium 139 135 - 145 mmol/L   Potassium 3.5 3.5 - 5.1 mmol/L   Chloride 104  101 - 111 mmol/L   CO2 29 22 - 32 mmol/L   Glucose, Bld 99 65 - 99 mg/dL   BUN 7 6 - 20 mg/dL   Creatinine, Ser 0.95 0.61 - 1.24 mg/dL   Calcium 8.6 (L) 8.9 - 10.3 mg/dL   GFR calc non Af Amer >60 >60 mL/min   GFR calc Af Amer >60 >60 mL/min    Comment: (NOTE) The eGFR has been calculated using the CKD EPI equation. This calculation has not been validated in all clinical situations. eGFR's persistently <60 mL/min signify possible Chronic Kidney Disease.    Anion gap 6 5 - 15    Imaging / Studies: Dg Abd Portable 1v  07/04/2015   CLINICAL DATA:  Acute lower abdominal pain.  EXAM: PORTABLE ABDOMEN - 1 VIEW  COMPARISON:  None.  FINDINGS: No small bowel dilatation is noted. Phleboliths are noted in the pelvis. Residual contrast is seen in the right and left colon. Mildly dilated air-filled colon is noted suggesting colonic ileus.  IMPRESSION: Mildly dilated air-filled colon is noted suggesting colonic ileus. No definite evidence of bowel  obstruction is noted.   Electronically Signed   By: Marijo Conception, M.D.   On: 07/04/2015 16:23    Medications / Allergies:  Scheduled Meds: . cefoTEtan (CEFOTAN) IV  1 g Intravenous Q12H  . diazepam  2.5 mg Oral Once  . docusate sodium  100 mg Oral BID  . hydrocortisone-pramoxine  1 applicator Rectal BID  . polyethylene glycol  17 g Oral Daily  . psyllium  1 packet Oral Daily   Continuous Infusions: . sodium chloride 75 mL/hr at 07/05/15 1929  . ropivacaine (PF) 2 mg/ml (0.2%)     PRN Meds:.acetaminophen **OR** [DISCONTINUED] acetaminophen, HYDROmorphone (DILAUDID) injection, morphine injection, ondansetron **OR** ondansetron (ZOFRAN) IV, oxyCODONE, zolpidem  Antibiotics: Anti-infectives    Start     Dose/Rate Route Frequency Ordered Stop   07/04/15 2200  cefoTEtan (CEFOTAN) 1 g in dextrose 5 % 50 mL IVPB     1 g 100 mL/hr over 30 Minutes Intravenous Every 12 hours 07/04/15 1551     07/04/15 0200  cefoTEtan (CEFOTAN) 2 g in dextrose 5 % 50 mL  IVPB     2 g 100 mL/hr over 30 Minutes Intravenous Every 12 hours 07/03/15 1732 07/04/15 1128   07/03/15 1100  cefoTEtan (CEFOTAN) 2 g in dextrose 5 % 50 mL IVPB     2 g 100 mL/hr over 30 Minutes Intravenous  Once 07/03/15 1015 07/03/15 1421        Assessment/Plan POD#3 EUA, complete external hemorrhoidectomy---Dr. Zella Richer -pain controlled, tolerating POs, ambulating, having BMs.  Stable for discharge from surgical standpoint. -give valium x1 now to see if it will help with spasms.  If so, plan to DC with 2.5 valium PRN.  I will check on him later this morning.   -Sitz baths TID, colace and miralax   Erby Pian, ANP-BC [facility] Pager [phone](7A-4:30P)   07/06/2015 7:35 AM

## 2015-07-06 NOTE — Discharge Summary (Signed)
Physician Discharge Summary  Bruce Shelton RUE:[id number] DOB: 10/13/1986 DOA: 07/01/2015  PCP: Karie Chimera, MD  Admit date: 07/01/2015 Discharge date: 07/06/2015  Time spent: < 25 minutes  Recommendations for Outpatient Follow-up:  1. Follow up with Dr. Abbey Chatters in 2 weeks  Discharge Diagnoses:  Principal Problem:   AKI (acute kidney injury) Active Problems:   Dehydration   Hemorrhoid  Discharge Condition: stable  Diet recommendation: regular  Filed Weights   07/01/15 2317 07/03/15 1715  Weight: 81.647 kg (180 lb) 81.647 kg (180 lb)   History of present illness:  Bruce Shelton is a 29 y.o. male with Past medical history of hemorrhoid. The patient presents with hemorrhoid associated with pain. Patient mentions this has been ongoing since last 4 weeks. Patient was initially seen in ER on 8/ at which time his hemorrhoid was noted as grape sized. Patient was discussed with general surgery and was discharged home with sitz baths and stool softener as well as pain control. Patient has been taking when necessary ibuprofen. Patient has been having daily vomiting and has significant nausea and because of that has poor oral intake. He complains of lower abdominal pain. He also complains of significant pain with medication. He complains of bleeding that the stool as well. Denies any fever or chills denies any chest pain or shortness of breath. He had an outpatient follow-up and at which time he was recommended for ice application. The patient is coming from home. At his baseline ambulates without any support And is independent for most of his ADL manages his medication on his own.  Hospital Course:  External hemorrhoid - s/p complete external hemorrhoidectomy 8/22, pain controlled by epidural initially per Anesthesiology, this was removed on 8/24, his pain was able to be controlled on oral agents. He was discharged home on stable condition to follow up with surgery as an outpatient in 2  weeks.  Ab pain / vomiting - colonic ileus on abdominal film, this has resolved, patient has had BMs and is tolerating a regular diet.  Fever - no obvious source other than #1, possible bacterial translocation, he was discharged on Augmentin and Metronidazole for 5 additional days.  AKI - resolved with hydration. Continue hydration.  Procedures:  s/p complete external hemorrhoidectomy   Consultations:  General surgery   Discharge Exam: Filed Vitals:   07/05/15 1510 07/05/15 2103 07/06/15 0036 07/06/15 0440  BP: 130/81 150/91 135/79 128/83  Pulse: 58 70 59 62  Temp: 99.9 F (37.7 C) 99.1 F (37.3 C) 98.7 F (37.1 C) 97 F (36.1 C)  TempSrc: Oral Oral Oral Oral  Resp: Height:      Weight:      SpO2: 99% 100% 99% 97%    General: NAD Cardiovascular: RRR Respiratory: CTA biL  Discharge Instructions     Medication List    STOP taking these medications        HYDROcodone-acetaminophen 5-325 MG per tablet  Commonly known as:  NORCO/VICODIN     hydrocortisone-pramoxine rectal foam  Commonly known as:  PROCTOFOAM HC     Lidocaine (Anorectal) 5 % Crea     ondansetron 8 MG disintegrating tablet  Commonly known as:  ZOFRAN ODT     oxyCODONE-acetaminophen 5-325 MG per tablet  Commonly known as:  PERCOCET/ROXICET      TAKE these medications        amoxicillin-clavulanate 875-125 MG per tablet  Commonly known as:  AUGMENTIN  Take 1 tablet by mouth 2 (  two) times daily.     diazepam 5 MG tablet  Commonly known as:  VALIUM  Take 0.5 tablets (2.5 mg total) by mouth every 6 (six) hours as needed for muscle spasms.     docusate sodium 100 MG capsule  Commonly known as:  COLACE  Take 1 capsule (100 mg total) by mouth 2 (two) times daily.     ibuprofen 200 MG tablet  Commonly known as:  ADVIL,MOTRIN  Take 800 mg by mouth every 6 (six) hours as needed for moderate pain.     metroNIDAZOLE 500 MG tablet  Commonly known as:  FLAGYL  Take 1 tablet (500 mg  total) by mouth 3 (three) times daily.     oxyCODONE 5 MG immediate release tablet  Commonly known as:  Oxy IR/ROXICODONE  Take 1-2 tablets (5-10 mg total) by mouth every 4 (four) hours as needed for moderate pain or severe pain.     polyethylene glycol packet  Commonly known as:  MIRALAX / GLYCOLAX  Take 17 g by mouth daily.     psyllium 95 % Pack  Commonly known as:  HYDROCIL/METAMUCIL  Take 1 packet by mouth daily.           Follow-up Information    Follow up with ROSENBOWER,TODD J, MD. Call in 2 weeks.   Specialty:  General Surgery   Why:  2-3 weeks for a wound check   Contact information:   [address] [place] [address] [phone]       The results of significant diagnostics from this hospitalization (including imaging, microbiology, ancillary and laboratory) are listed below for reference.    Significant Diagnostic Studies: Ct Abdomen Pelvis W Contrast  07/02/2015   CLINICAL DATA:  Rectal pain and drainage 4 days. Severe hemorrhoids with concern for abscess.  EXAM: CT ABDOMEN AND PELVIS WITH CONTRAST  TECHNIQUE: Multidetector CT imaging of the abdomen and pelvis was performed using the standard protocol following bolus administration of intravenous contrast.  CONTRAST:  50mL OMNIPAQUE IOHEXOL 300 MG/ML SOLN, OMNIPAQUE IOHEXOL 300 MG/ML SOLN  COMPARISON:  None.  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  LOWER CHEST: Fatty Bochdalek's hernia on the left.  ABDOMEN/PELVIS:  Liver: No focal abnormality.  Biliary: No evidence of biliary obstruction or stone.  Pancreas: Unremarkable.  Spleen: Unremarkable.  Adrenals: Unremarkable.  Kidneys and ureters: No hydronephrosis or stone.  Bladder: Unremarkable.  Reproductive: No pathologic findings.  Bowel: Lobulated thickening at the anus correlating with history of hemorrhoids. By imaging, rectal prolapse, mass or verrucae would have the same appearance. There is no fluid  collection or inflammatory change. No soft tissue gas. No bowel obstruction. No appendicitis (appendix in shallow right inguinal hernia).  Retroperitoneum: No mass or adenopathy.  Peritoneum: No ascites or pneumoperitoneum.  Vascular: No acute abnormality.  OSSEOUS: No acute abnormalities.  IMPRESSION: 1. Anal mass correlating with history of severe hemorrhoids, prolapsed appearing. No abscess. 2. Shallow right inguinal hernia containing appendix.   Electronically Signed   By: Marnee Spring M.D.   On: 07/02/2015 03:07   Dg Abd Portable 1v  07/04/2015   CLINICAL DATA:  Acute lower abdominal pain.  EXAM: PORTABLE ABDOMEN - 1 VIEW  COMPARISON:  None.  FINDINGS: No small bowel dilatation is noted. Phleboliths are noted in the pelvis. Residual contrast is seen in the right and left colon. Mildly dilated air-filled colon is noted suggesting colonic ileus.  IMPRESSION: Mildly dilated air-filled colon is noted  suggesting colonic ileus. No definite evidence of bowel obstruction is noted.   Electronically Signed   By: Lupita Raider, M.D.   On: 07/04/2015 16:23    Microbiology: Recent Results (from the past 240 hour(s))  Surgical pcr screen     Status: Abnormal   Collection Time: 07/03/15 11:03 AM  Result Value Ref Range Status   MRSA, PCR NEGATIVE NEGATIVE Final   Staphylococcus aureus POSITIVE (A) NEGATIVE Final    Comment:        The Xpert SA Assay (FDA approved for NASAL specimens in patients over 10 years of age), is one component of a comprehensive surveillance program.  Test performance has been validated by [facility] for patients greater than or equal to 64 year old. It is not intended to diagnose infection nor to guide or monitor treatment.      Labs: Basic Metabolic Panel:  Recent Labs Lab 07/01/15 2346 07/02/15 0600 07/03/15 0518 07/05/15 0443  NA 133* 135 135 139  K 3.4* 3.4* 3.3* 3.5  CL 97* 103 103 104  CO2 22 25 26 29   GLUCOSE 111* 99 123* 99  BUN 30* 23* 13 7    CREATININE 1.80* 1.27* 1.00 0.95  CALCIUM 9.7 8.6* 8.5* 8.6*  MG  --   --  2.1  --    Liver Function Tests:  Recent Labs Lab 07/02/15 0600  AST 26  ALT 27  ALKPHOS 57  BILITOT 1.1  PROT 7.0  ALBUMIN 4.0   CBC:  Recent Labs Lab 07/01/15 2346 07/02/15 0600 07/02/15 1215 07/05/15 0443  WBC 10.5 10.5 7.0 10.7*  NEUTROABS 6.1 4.9 3.8  --   HGB 16.2 13.4 13.1 11.6*  HCT 46.2 39.1 39.1 34.6*  MCV 85.9 88.1 88.3 87.2  PLT 180 158 160 150     Signed:  Gurkirat Basher  [facility] 07/06/2015, 3:15 PM

## 2015-07-06 NOTE — Discharge Instructions (Signed)
Home Instructions Following Excision of Thrombosed Hemorrhoid  Wound care - cleanse the area with mild soap and warm water and place a dry dressing and then brief underwear.  You may change this dressing as needed as your wound may continue to drain.   - Beginning today, sit in a tub of warm water for 15-20 minutes at least three times per day and after bowel movements.  This will help with healing, pain and discomfort. - A small amount of bleeding is to be expected.  If you notice an increase in the bleeding, place a large piece of cotton (about the size of a golf ball) next to the anal opening and sit on a hard surface for 15 minutes.  If the bleeding persists or if you are concerned, please call the office.  Do not sit on rubber rings.  Instead, sit on a soft pillow.   -you may take valium as needed for spasms in your rectum over the next week.  Stop taking thereafter as this should improve. -you may take oxycodone for pain.  Please be cautious and DO not take oxycodone and valium together. -be sure to continue to take colace, metamucil, miralax to have one soft bowel movement per day.    Diet -Eat a regular diet.  Avoid foods that may constipate you or give you diarrhea.  Drink 6-8 glasses of water a day and avoid seeds, nuts and popcorn until the area heals.  Medication -Take pain medication as directed.  Do not drive or operate machinery if you are taking a prescription pain medication.   - We recommend Extra Strength Tylenol for mild to moderate pain.  This can be taken as instructed on the bottle.   - If you are given a prescription for antibiotics, take as instructed by your doctor until the entire course is completed   Activity Do not lift anything more than 10lbs for 1 week.   Call the office if you have any questions.  Call IMMEDIATELY if you should develop persistent heavy rectal bleeding, increase in pain, difficulty urinating or fever greater than

## 2015-08-31 ENCOUNTER — Emergency Department (HOSPITAL_COMMUNITY)
Admission: EM | Admit: 2015-08-31 | Discharge: 2015-08-31 | Disposition: A | Discharge status: Home / Self Care | Payer: BLUE CROSS/BLUE SHIELD | Attending: Emergency Medicine | Admitting: Emergency Medicine

## 2015-08-31 ENCOUNTER — Encounter (HOSPITAL_COMMUNITY): Payer: Self-pay | Admitting: Emergency Medicine

## 2015-08-31 DIAGNOSIS — E876 Hypokalemia: Secondary | ICD-10-CM | POA: Insufficient documentation

## 2015-08-31 DIAGNOSIS — R6883 Chills (without fever): Secondary | ICD-10-CM | POA: Diagnosis not present

## 2015-08-31 DIAGNOSIS — Z8719 Personal history of other diseases of the digestive system: Secondary | ICD-10-CM | POA: Insufficient documentation

## 2015-08-31 DIAGNOSIS — R1030 Lower abdominal pain, unspecified: Secondary | ICD-10-CM | POA: Insufficient documentation

## 2015-08-31 DIAGNOSIS — R112 Nausea with vomiting, unspecified: Secondary | ICD-10-CM | POA: Insufficient documentation

## 2015-08-31 DIAGNOSIS — Z79899 Other long term (current) drug therapy: Secondary | ICD-10-CM | POA: Diagnosis not present

## 2015-08-31 DIAGNOSIS — R1032 Left lower quadrant pain: Secondary | ICD-10-CM

## 2015-08-31 DIAGNOSIS — R1031 Right lower quadrant pain: Secondary | ICD-10-CM

## 2015-08-31 LAB — URINALYSIS, ROUTINE W REFLEX MICROSCOPIC
Bilirubin Urine: NEGATIVE
GLUCOSE, UA: NEGATIVE mg/dL
HGB URINE DIPSTICK: NEGATIVE
Ketones, ur: NEGATIVE mg/dL
Leukocytes, UA: NEGATIVE
Nitrite: NEGATIVE
PH: 5 (ref 5.0–8.0)
Protein, ur: NEGATIVE mg/dL
SPECIFIC GRAVITY, URINE: 1.013 (ref 1.005–1.030)
Urobilinogen, UA: 0.2 mg/dL (ref 0.0–1.0)

## 2015-08-31 LAB — COMPREHENSIVE METABOLIC PANEL
ALK PHOS: 83 U/L (ref 38–126)
ALT: 64 U/L — AB (ref 17–63)
AST: 47 U/L — ABNORMAL HIGH (ref 15–41)
Albumin: 4.8 g/dL (ref 3.5–5.0)
Anion gap: 12 (ref 5–15)
BUN: 13 mg/dL (ref 6–20)
CALCIUM: 10.1 mg/dL (ref 8.9–10.3)
CO2: 23 mmol/L (ref 22–32)
CREATININE: 1.1 mg/dL (ref 0.61–1.24)
Chloride: 101 mmol/L (ref 101–111)
Glucose, Bld: 122 mg/dL — ABNORMAL HIGH (ref 65–99)
Potassium: 3.2 mmol/L — ABNORMAL LOW (ref 3.5–5.1)
Sodium: 136 mmol/L (ref 135–145)
TOTAL PROTEIN: 9 g/dL — AB (ref 6.5–8.1)
Total Bilirubin: 0.9 mg/dL (ref 0.3–1.2)

## 2015-08-31 LAB — CBC
HCT: 45.4 % (ref 39.0–52.0)
Hemoglobin: 15.7 g/dL (ref 13.0–17.0)
MCH: 30.8 pg (ref 26.0–34.0)
MCHC: 34.6 g/dL (ref 30.0–36.0)
MCV: 89 fL (ref 78.0–100.0)
Platelets: 191 10*3/uL (ref 150–400)
RBC: 5.1 MIL/uL (ref 4.22–5.81)
RDW: 13.3 % (ref 11.5–15.5)
WBC: 9.9 10*3/uL (ref 4.0–10.5)

## 2015-08-31 LAB — LIPASE, BLOOD: Lipase: 19 U/L (ref 11–51)

## 2015-08-31 MED ORDER — SODIUM CHLORIDE 0.9 % IV BOLUS (SEPSIS)
1000.0000 mL | Freq: Once | INTRAVENOUS | Status: AC
  Administered 2015-08-31: 1000 mL via INTRAVENOUS

## 2015-08-31 MED ORDER — ONDANSETRON 8 MG PO TBDP: 8.0000 mg | ORAL_TABLET | Freq: Three times a day (TID) | ORAL | Status: DC | PRN

## 2015-08-31 MED ORDER — DIPHENHYDRAMINE HCL 50 MG/ML IJ SOLN
25.0000 mg | Freq: Once | INTRAMUSCULAR | Status: AC
  Administered 2015-08-31: 25 mg via INTRAVENOUS
  Filled 2015-08-31: qty 1

## 2015-08-31 MED ORDER — METOCLOPRAMIDE HCL 5 MG/ML IJ SOLN
10.0000 mg | Freq: Once | INTRAMUSCULAR | Status: AC
  Administered 2015-08-31: 10 mg via INTRAVENOUS
  Filled 2015-08-31: qty 2

## 2015-08-31 MED ORDER — ONDANSETRON 4 MG PO TBDP
4.0000 mg | ORAL_TABLET | Freq: Once | ORAL | Status: AC | PRN
  Administered 2015-08-31: 4 mg via ORAL
  Filled 2015-08-31: qty 1

## 2015-08-31 MED ORDER — DICYCLOMINE HCL 20 MG PO TABS: 20.0000 mg | ORAL_TABLET | Freq: Three times a day (TID) | ORAL | Status: DC | PRN

## 2015-08-31 MED ORDER — POTASSIUM CHLORIDE CRYS ER 20 MEQ PO TBCR: 20.0000 meq | EXTENDED_RELEASE_TABLET | Freq: Two times a day (BID) | ORAL | Status: DC

## 2015-08-31 NOTE — ED Notes (Signed)
Made first request for urine sample,pt unable to provide one at this time. 

## 2015-08-31 NOTE — ED Notes (Signed)
Pt states he has been vomiting for 2 days  Pt is c/o abd pain and cramping  Pt denies diarrhea   Pt's hands are drawn up and states he feels like his has needles sticking in his face and hands

## 2015-08-31 NOTE — Discharge Instructions (Signed)
Drink plenty of fluids. Try the bentyl if your abdominal pain returns. Try the zofran under your tongue for nausea or vomiting. Call [facility] to get an appointment to have your abdominal pain and vomiting evaluated. Return if you get worse again.    Hypokalemia Hypokalemia means that the amount of potassium in the blood is lower than normal.Potassium is a chemical, called an electrolyte, that helps regulate the amount of fluid in the body. It also stimulates muscle contraction and helps nerves function properly.Most of the body's potassium is inside of cells, and only a very small amount is in the blood. Because the amount in the blood is so small, minor changes can be life-threatening. CAUSES  Antibiotics.  Diarrhea or vomiting.  Using laxatives too much, which can cause diarrhea.  Chronic kidney disease.  Water pills (diuretics).  Eating disorders (bulimia).  Low magnesium level.  Sweating a lot. SIGNS AND SYMPTOMS  Weakness.  Constipation.  Fatigue.  Muscle cramps.  Mental confusion.  Skipped heartbeats or irregular heartbeat (palpitations).  Tingling or numbness. DIAGNOSIS  Your health care provider can diagnose hypokalemia with blood tests. In addition to checking your potassium level, your health care provider may also check other lab tests. TREATMENT Hypokalemia can be treated with potassium supplements taken by mouth or adjustments in your current medicines. If your potassium level is very low, you may need to get potassium through a vein (IV) and be monitored in the hospital. A diet high in potassium is also helpful. Foods high in potassium are:  Nuts, such as peanuts and pistachios.  Seeds, such as sunflower seeds and pumpkin seeds.  Peas, lentils, and lima beans.  Whole grain and bran cereals and breads.  Fresh fruit and vegetables, such as apricots, avocado, bananas, cantaloupe, kiwi, oranges, tomatoes, asparagus, and potatoes.  Orange  and tomato juices.  Red meats.  Fruit yogurt. HOME CARE INSTRUCTIONS  Take all medicines as prescribed by your health care provider.  Maintain a healthy diet by including nutritious food, such as fruits, vegetables, nuts, whole grains, and lean meats.  If you are taking a laxative, be sure to follow the directions on the label. SEEK MEDICAL CARE IF:  Your weakness gets worse.  You feel your heart pounding or racing.  You are vomiting or having diarrhea.  You are diabetic and having trouble keeping your blood glucose in the normal range. SEEK IMMEDIATE MEDICAL CARE IF:  You have chest pain, shortness of breath, or dizziness.  You are vomiting or having diarrhea for more than 2 days.  You faint. MAKE SURE YOU:   Understand these instructions.  Will watch your condition.  Will get help right away if you are not doing well or get worse.   This information is not intended to replace advice given to you by your health care provider. Make sure you discuss any questions you have with your health care provider.   Document Released: 10/28/2005 Document Revised: 11/18/2014 Document Reviewed: 04/30/2013 Elsevier Interactive Patient Education 2016 Else[employer] Inc.  Nausea and Vomiting Nausea means you feel sick to your stomach. Throwing up (vomiting) is a reflex where stomach contents come out of your mouth. HOME CARE   Take medicine as told by your doctor.  Do not force yourself to eat. However, you do need to drink fluids.  If you feel like eating, eat a normal diet as told by your doctor.  Eat rice, wheat, potatoes, bread, lean meats, yogurt, fruits, and vegetables.  Avoid high-fat foods.  Drink enough fluids to keep your pee (urine) clear or pale yellow.  Ask your doctor how to replace body fluid losses (rehydrate). Signs of body fluid loss (dehydration) include:  Feeling very thirsty.  Dry lips and mouth.  Feeling dizzy.  Dark pee.  Peeing less than  normal.  Feeling confused.  Fast breathing or heart rate. GET HELP RIGHT AWAY IF:   You have blood in your throw up.  You have black or bloody poop (stool).  You have a bad headache or stiff neck.  You feel confused.  You have bad belly (abdominal) pain.  You have chest pain or trouble breathing.  You do not pee at least once every 8 hours.  You have cold, clammy skin.  You keep throwing up after 24 to 48 hours.  You have a fever. MAKE SURE YOU:   Understand these instructions.  Will watch your condition.  Will get help right away if you are not doing well or get worse.   This information is not intended to replace advice given to you by your health care provider. Make sure you discuss any questions you have with your health care provider.   Document Released: 04/15/2008 Document Revised: 01/20/2012 Document Reviewed: 03/29/2011 Elsevier Interactive Patient Education [employer]

## 2015-08-31 NOTE — ED Provider Notes (Signed)
CSN: [id number]     Arrival date & time 08/31/15  0046 History  By signing my name below, I, Bruce Shelton, attest that this documentation has been prepared under the direction and in the presence of Bruce Albe, MD at 0145. Electronically Signed: Budd Shelton, ED Scribe. 08/31/2015. 2:43 AM.    Chief Complaint  Patient presents with  . Emesis  . Abdominal Pain   The history is provided by the patient and a friend. No language interpreter was used.   HPI Comments: Bruce Shelton is a 29 y.o. male who presents to the Emergency Department complaining of multiple episodes of emesis and dry heaves with bilateral lower abdominal pain onset 2 days ago. He reports associated chills.  He notes that this abdominal pain feels worse than usual. Per girlfriend, pt has occasional vomiting and abdominal pain for the past year without any triggers. She states that it is unusual for the vomiting to last this long. She notes pt has a PMHx of hemorrhoids. Pt states he smokes marijuana. He reports he works in [employer]. He denies having eaten any suspicious foods. He denies any sick contacts. He denies drinking alcohol. Pt denies diarrhea and difficulty urinating.    PCP Dr Bruce Shelton  Past Medical History  Diagnosis Date  . Hemorrhoid    Past Surgical History  Procedure Laterality Date  . Hemorrhoid surgery N/A 07/03/2015    Procedure: Exam under anesthesia, HEMORRHOIDECTOMY;  Surgeon: Avel Peace, MD;  Location: [facility];  Service: General;  Laterality: N/A;  . Hernia repair    . Wisdom tooth extraction     No family history on file. Social History  Substance Use Topics  . Smoking status: Never Smoker   . Smokeless tobacco: None  . Alcohol Use: No  employed  Review of Systems  Constitutional: Positive for chills.  Gastrointestinal: Positive for nausea, vomiting and abdominal pain. Negative for diarrhea.  Genitourinary: Negative for difficulty urinating.  All other systems reviewed and are  negative.   Allergies  Review of patient's allergies indicates no known allergies.  Home Medications   Prior to Admission medications   Medication Sig Start Date End Date Taking? Authorizing Provider  docusate sodium (COLACE) 100 MG capsule Take 1 capsule (100 mg total) by mouth 2 (two) times daily. 07/06/15  Yes Emina Riebock, NP  ibuprofen (ADVIL,MOTRIN) 200 MG tablet Take 800 mg by mouth every 6 (six) hours as needed for moderate pain.   Yes Historical Provider, MD  psyllium (HYDROCIL/METAMUCIL) 95 % PACK Take 1 packet by mouth daily. 07/06/15  Yes Emina Riebock, NP  amoxicillin-clavulanate (AUGMENTIN) 875-125 MG per tablet Take 1 tablet by mouth 2 (two) times daily. Patient not taking: Reported on 08/31/2015 07/06/15   Bruce Norris, NP  diazepam (VALIUM) 5 MG tablet Take 0.5 tablets (2.5 mg total) by mouth every 6 (six) hours as needed for muscle spasms. Patient not taking: Reported on 08/31/2015 07/06/15   Bruce Norris, NP  dicyclomine (BENTYL) 20 MG tablet Take 1 tablet (20 mg total) by mouth 3 (three) times daily with meals as needed for spasms. 08/31/15   Bruce Albe, MD  metroNIDAZOLE (FLAGYL) 500 MG tablet Take 1 tablet (500 mg total) by mouth 3 (three) times daily. Patient not taking: Reported on 08/31/2015 07/06/15   Emina Riebock, NP  ondansetron (ZOFRAN ODT) 8 MG disintegrating tablet Take 1 tablet (8 mg total) by mouth every 8 (eight) hours as needed for nausea or vomiting. 08/31/15   Bruce Albe, MD  oxyCODONE (OXY IR/ROXICODONE) 5 MG immediate release tablet Take 1-2 tablets (5-10 mg total) by mouth every 4 (four) hours as needed for moderate pain or severe pain. Patient not taking: Reported on 08/31/2015 07/06/15   Bruce Norris, NP  polyethylene glycol (MIRALAX / GLYCOLAX) packet Take 17 g by mouth daily. Patient not taking: Reported on 08/31/2015 07/06/15   Bruce Norris, NP  potassium chloride SA (K-DUR,KLOR-CON) 20 MEQ tablet Take 1 tablet (20 mEq total) by mouth 2 (two) times  daily. 08/31/15   Bruce Albe, MD   BP 106/62 mmHg  Pulse 73  Temp(Src) 99.2 F (37.3 C) (Oral)  Resp 18  SpO2 97%  Vital signs normal except low grade temp  Physical Exam  Constitutional: He is oriented to person, place, and time. He appears well-developed and well-nourished.  Non-toxic appearance. He does not appear ill. He appears distressed.  Actively retching, began having chills during the interview, pt is diaphoretic and feels hot to touch  HENT:  Head: Normocephalic and atraumatic.  Right Ear: External ear normal.  Left Ear: External ear normal.  Nose: Nose normal. No mucosal edema or rhinorrhea.  Mouth/Throat: Mucous membranes are normal. No dental abscesses or uvula swelling.  Dry mm  Eyes: Conjunctivae and EOM are normal. Pupils are equal, round, and reactive to light.  Neck: Normal range of motion and full passive range of motion without pain. Neck supple.  Cardiovascular: Normal rate, regular rhythm and normal heart sounds.  Exam reveals no gallop and no friction rub.   No murmur heard. Pulmonary/Chest: Effort normal and breath sounds normal. No respiratory distress. He has no wheezes. He has no rhonchi. He has no rales. He exhibits no tenderness and no crepitus.  Abdominal: Soft. Normal appearance and bowel sounds are normal. He exhibits no distension. There is tenderness. There is no rebound and no guarding.    Diffuse lower abdominal TTP  Musculoskeletal: Normal range of motion. He exhibits no edema or tenderness.  Moves all extremities well.   Neurological: He is alert and oriented to person, place, and time. He has normal strength. No cranial nerve deficit.  Skin: Skin is warm and intact. No rash noted. He is diaphoretic. No erythema. No pallor.  Psychiatric: He has a normal mood and affect. His speech is normal and behavior is normal. His mood appears not anxious.  Nursing note and vitals reviewed.   ED Course  Procedures   Medications  ondansetron  (ZOFRAN-ODT) disintegrating tablet 4 mg (4 mg Oral Given 08/31/15 0117)  metoCLOPramide (REGLAN) injection 10 mg (10 mg Intravenous Given 08/31/15 0206)  diphenhydrAMINE (BENADRYL) injection 25 mg (25 mg Intravenous Given 08/31/15 0206)  sodium chloride 0.9 % bolus 1,000 mL (1,000 mLs Intravenous New Bag/Given 08/31/15 0347)  sodium chloride 0.9 % bolus 1,000 mL (0 mLs Intravenous Stopped 08/31/15 0505)  sodium chloride 0.9 % bolus 1,000 mL (0 mLs Intravenous Stopped 08/31/15 0505)    DIAGNOSTIC STUDIES: Oxygen Saturation is 100% on RA, normal by my interpretation.    COORDINATION OF CARE: 1:50 AM - Discussed plans to order anti-nausea medication and IV fluids. Pt advised of plan for treatment and pt agrees.  2:41 AM -Pt is quiet and resting in bed. Vomiting has resolved. Discussed test results.  Recheck 4 AM patient sleeping. He has not had urinary output after 2 L IV fluid. He is willing to try oral fluids. He was given a third liter of normal saline.  At time of discharge patient had drank one cup of  fluids without vomiting or worsening of his symptoms. He received 3 L IV fluids for his dehydration. I gave him a prescription for Bentyl to try for his abdominal pain and Zofran to use for his nausea or vomiting. He was given referral to a gastroenterologist to further evaluate his abdominal pain and vomiting.  Labs Review Results for orders placed or performed during the hospital encounter of 08/31/15  Lipase, blood  Result Value Ref Range   Lipase 19 11 - 51 U/L  Comprehensive metabolic panel  Result Value Ref Range   Sodium 136 135 - 145 mmol/L   Potassium 3.2 (L) 3.5 - 5.1 mmol/L   Chloride 101 101 - 111 mmol/L   CO2 23 22 - 32 mmol/L   Glucose, Bld 122 (H) 65 - 99 mg/dL   BUN 13 6 - 20 mg/dL   Creatinine, Ser 1.61 0.61 - 1.24 mg/dL   Calcium 09.6 8.9 - 04.5 mg/dL   Total Protein 9.0 (H) 6.5 - 8.1 g/dL   Albumin 4.8 3.5 - 5.0 g/dL   AST 47 (H) 15 - 41 U/L   ALT 64 (H) 17 -  63 U/L   Alkaline Phosphatase 83 38 - 126 U/L   Total Bilirubin 0.9 0.3 - 1.2 mg/dL   GFR calc non Af Amer >60 >60 mL/min   GFR calc Af Amer >60 >60 mL/min   Anion gap 12 5 - 15  CBC  Result Value Ref Range   WBC 9.9 4.0 - 10.5 K/uL   RBC 5.10 4.22 - 5.81 MIL/uL   Hemoglobin 15.7 13.0 - 17.0 g/dL   HCT 40.9 81.1 - 91.4 %   MCV 89.0 78.0 - 100.0 fL   MCH 30.8 26.0 - 34.0 pg   MCHC 34.6 30.0 - 36.0 g/dL   RDW 78.2 95.6 - 21.3 %   Platelets 191 150 - 400 K/uL  Urinalysis, Routine w reflex microscopic (not at [facility])  Result Value Ref Range   Color, Urine YELLOW YELLOW   APPearance CLEAR CLEAR   Specific Gravity, Urine 1.013 1.005 - 1.030   pH 5.0 5.0 - 8.0   Glucose, UA NEGATIVE NEGATIVE mg/dL   Hgb urine dipstick NEGATIVE NEGATIVE   Bilirubin Urine NEGATIVE NEGATIVE   Ketones, ur NEGATIVE NEGATIVE mg/dL   Protein, ur NEGATIVE NEGATIVE mg/dL   Urobilinogen, UA 0.2 0.0 - 1.0 mg/dL   Nitrite NEGATIVE NEGATIVE   Leukocytes, UA NEGATIVE NEGATIVE   Laboratory interpretation all normal except hypokalemia c/w vomiting    Imaging Review No results found. I have personally reviewed and evaluated these images and lab results as part of my medical decision-making.   EKG Interpretation None      MDM   Final diagnoses:  Bilateral lower abdominal pain  Nausea and vomiting, vomiting of unspecified type  Hypokalemia   New Prescriptions   DICYCLOMINE (BENTYL) 20 MG TABLET    Take 1 tablet (20 mg total) by mouth 3 (three) times daily with meals as needed for spasms.   ONDANSETRON (ZOFRAN ODT) 8 MG DISINTEGRATING TABLET    Take 1 tablet (8 mg total) by mouth every 8 (eight) hours as needed for nausea or vomiting.   POTASSIUM CHLORIDE SA (K-DUR,KLOR-CON) 20 MEQ TABLET    Take 1 tablet (20 mEq total) by mouth 2 (two) times daily.    Plan discharge  Bruce Albe, MD, FACEP   I personally performed the services described in this documentation, which was scribed in my presence. The  recorded information  has been reviewed and considered.  Bruce AlbeIva Houston Surges, MD, Concha PyoFACEP   Gina Costilla, MD 08/31/15 [phone]

## 2015-08-31 NOTE — ED Notes (Signed)
Fluids given. Pt drank about half a 240 ml cup of water and has tolerated  It.

## 2015-08-31 NOTE — ED Notes (Addendum)
Pt from home with nausea and vomiting x 2 days. Pt states he has vomited 5-7 times in the past 24 hours. Pt states he has had these symptoms before. Pt had a bowel movement yesterday. Pt smokes mariajuana daily, and pt has slight wheezes in all lobes. Pt's abdomen is soft and nondistended. Pt states his pain is primarily in the lower part of his abdomen in both sides.

## 2015-11-08 ENCOUNTER — Emergency Department (HOSPITAL_COMMUNITY)
Admission: EM | Admit: 2015-11-08 | Discharge: 2015-11-08 | Disposition: A | Discharge status: Home / Self Care | Payer: BLUE CROSS/BLUE SHIELD | Attending: Emergency Medicine | Admitting: Emergency Medicine

## 2015-11-08 ENCOUNTER — Encounter (HOSPITAL_COMMUNITY): Payer: Self-pay | Admitting: Family Medicine

## 2015-11-08 DIAGNOSIS — E876 Hypokalemia: Secondary | ICD-10-CM | POA: Diagnosis not present

## 2015-11-08 DIAGNOSIS — R112 Nausea with vomiting, unspecified: Secondary | ICD-10-CM | POA: Diagnosis not present

## 2015-11-08 DIAGNOSIS — Z8719 Personal history of other diseases of the digestive system: Secondary | ICD-10-CM | POA: Insufficient documentation

## 2015-11-08 DIAGNOSIS — R202 Paresthesia of skin: Secondary | ICD-10-CM | POA: Insufficient documentation

## 2015-11-08 DIAGNOSIS — R1084 Generalized abdominal pain: Secondary | ICD-10-CM | POA: Diagnosis present

## 2015-11-08 DIAGNOSIS — R109 Unspecified abdominal pain: Secondary | ICD-10-CM | POA: Diagnosis not present

## 2015-11-08 HISTORY — DX: Hypokalemia: E87.6

## 2015-11-08 LAB — CBC
HCT: 48.9 % (ref 39.0–52.0)
Hemoglobin: 16.7 g/dL (ref 13.0–17.0)
MCH: 30.5 pg (ref 26.0–34.0)
MCHC: 34.2 g/dL (ref 30.0–36.0)
MCV: 89.2 fL (ref 78.0–100.0)
PLATELETS: 209 10*3/uL (ref 150–400)
RBC: 5.48 MIL/uL (ref 4.22–5.81)
RDW: 12.9 % (ref 11.5–15.5)
WBC: 11.7 10*3/uL — ABNORMAL HIGH (ref 4.0–10.5)

## 2015-11-08 LAB — URINALYSIS, ROUTINE W REFLEX MICROSCOPIC
BILIRUBIN URINE: NEGATIVE
GLUCOSE, UA: NEGATIVE mg/dL
HGB URINE DIPSTICK: NEGATIVE
KETONES UR: NEGATIVE mg/dL
Leukocytes, UA: NEGATIVE
Nitrite: NEGATIVE
PROTEIN: 30 mg/dL — AB
Specific Gravity, Urine: 1.023 (ref 1.005–1.030)
pH: 8 (ref 5.0–8.0)

## 2015-11-08 LAB — COMPREHENSIVE METABOLIC PANEL
ALBUMIN: 5.1 g/dL — AB (ref 3.5–5.0)
ALT: 41 U/L (ref 17–63)
AST: 32 U/L (ref 15–41)
Alkaline Phosphatase: 68 U/L (ref 38–126)
Anion gap: 17 — ABNORMAL HIGH (ref 5–15)
BUN: 11 mg/dL (ref 6–20)
CALCIUM: 10.3 mg/dL (ref 8.9–10.3)
CHLORIDE: 101 mmol/L (ref 101–111)
CO2: 24 mmol/L (ref 22–32)
CREATININE: 1.12 mg/dL (ref 0.61–1.24)
GFR calc Af Amer: >60 mL/min (ref >60)
GFR calc non Af Amer: >60 mL/min (ref >60)
Glucose, Bld: 131 mg/dL — ABNORMAL HIGH (ref 65–99)
POTASSIUM: 3.4 mmol/L — AB (ref 3.5–5.1)
Sodium: 142 mmol/L (ref 135–145)
Total Bilirubin: 1.2 mg/dL (ref 0.3–1.2)
Total Protein: 9.2 g/dL — ABNORMAL HIGH (ref 6.5–8.1)

## 2015-11-08 LAB — URINE MICROSCOPIC-ADD ON: Bacteria, UA: NONE SEEN

## 2015-11-08 LAB — LIPASE, BLOOD: LIPASE: 22 U/L (ref 11–51)

## 2015-11-08 MED ORDER — DICYCLOMINE HCL 20 MG PO TABS: 20.0000 mg | ORAL_TABLET | Freq: Two times a day (BID) | ORAL | Status: DC

## 2015-11-08 MED ORDER — ONDANSETRON HCL 4 MG/2ML IJ SOLN
4.0000 mg | Freq: Once | INTRAMUSCULAR | Status: AC
  Administered 2015-11-08: 4 mg via INTRAVENOUS
  Filled 2015-11-08: qty 2

## 2015-11-08 MED ORDER — ONDANSETRON 4 MG PO TBDP: 4.0000 mg | ORAL_TABLET | Freq: Three times a day (TID) | ORAL | Status: DC | PRN

## 2015-11-08 MED ORDER — SODIUM CHLORIDE 0.9 % IV BOLUS (SEPSIS)
1000.0000 mL | Freq: Once | INTRAVENOUS | Status: AC
  Administered 2015-11-08: 1000 mL via INTRAVENOUS

## 2015-11-08 NOTE — ED Provider Notes (Signed)
CSN: [id number]     Arrival date & time 11/08/15  0131 History   First MD Initiated Contact with Patient 11/08/15 [phone]     Chief Complaint  Patient presents with  . Abdominal Pain  . Numbness     (Consider location/radiation/quality/duration/timing/severity/associated sxs/prior Treatment) Patient is a 29 y.o. male presenting with abdominal pain. The history is provided by the patient and medical records.  Abdominal Pain Associated symptoms: nausea and vomiting     29 y.o. M with hx of hemorrhoids and hypokalemia, presenting to the ED for abdominal pain, N/V that began 2 days ago.  Patient states he got up to get ready for work on Monday morning, felt nauseated and began vomiting. He states is intermittent nausea and vomiting since this time as well. He reports generalized abdominal cramping. He denies any fever or chills. He states earlier today she began having some paresthesias of his fingertips and is concerned his potassium is low as he had this happen in October this year as well.  He states paresthesias have resolved at this time.  Denies numbness of extremities.  Patient does take daily potassium supplements.  States his bowel movements have been fairly normal, he usually has loose stools due to the stool softeners he takes to prevent recurrent hemorrhoids.  He tried taking oral Zofran at home, however began vomiting. He was able to eat a piece of toast today. Vital signs stable.    Past Medical History  Diagnosis Date  . Hemorrhoid   . Hypokalemia    Past Surgical History  Procedure Laterality Date  . Hemorrhoid surgery N/A 07/03/2015    Procedure: Exam under anesthesia, HEMORRHOIDECTOMY;  Surgeon: Avel Peace, MD;  Location: [facility];  Service: General;  Laterality: N/A;  . Hernia repair    . Wisdom tooth extraction     History reviewed. No pertinent family history. Social History  Substance Use Topics  . Smoking status: Never Smoker   . Smokeless tobacco: None  . Alcohol  Use: No    Review of Systems  Gastrointestinal: Positive for nausea, vomiting and abdominal pain (Cramping).  All other systems reviewed and are negative.     Allergies  Review of patient's allergies indicates no known allergies.  Home Medications   Prior to Admission medications   Medication Sig Start Date End Date Taking? Authorizing Provider  ibuprofen (ADVIL,MOTRIN) 200 MG tablet Take 800 mg by mouth every 6 (six) hours as needed for moderate pain.   Yes Historical Provider, MD  ondansetron (ZOFRAN ODT) 8 MG disintegrating tablet Take 1 tablet (8 mg total) by mouth every 8 (eight) hours as needed for nausea or vomiting. 08/31/15  Yes Devoria Albe, MD  amoxicillin-clavulanate (AUGMENTIN) 875-125 MG per tablet Take 1 tablet by mouth 2 (two) times daily. Patient not taking: Reported on 08/31/2015 07/06/15   Ashok Norris, NP  diazepam (VALIUM) 5 MG tablet Take 0.5 tablets (2.5 mg total) by mouth every 6 (six) hours as needed for muscle spasms. Patient not taking: Reported on 08/31/2015 07/06/15   Ashok Norris, NP  dicyclomine (BENTYL) 20 MG tablet Take 1 tablet (20 mg total) by mouth 3 (three) times daily with meals as needed for spasms. Patient not taking: Reported on 11/08/2015 08/31/15   Devoria Albe, MD  docusate sodium (COLACE) 100 MG capsule Take 1 capsule (100 mg total) by mouth 2 (two) times daily. Patient not taking: Reported on 11/08/2015 07/06/15   Ashok Norris, NP  metroNIDAZOLE (FLAGYL) 500 MG tablet Take 1 tablet (  500 mg total) by mouth 3 (three) times daily. Patient not taking: Reported on 08/31/2015 07/06/15   Ashok NorrisEmina Riebock, NP  oxyCODONE (OXY IR/ROXICODONE) 5 MG immediate release tablet Take 1-2 tablets (5-10 mg total) by mouth every 4 (four) hours as needed for moderate pain or severe pain. Patient not taking: Reported on 08/31/2015 07/06/15   Ashok NorrisEmina Riebock, NP  polyethylene glycol (MIRALAX / GLYCOLAX) packet Take 17 g by mouth daily. Patient not taking: Reported on  08/31/2015 07/06/15   Ashok NorrisEmina Riebock, NP  potassium chloride SA (K-DUR,KLOR-CON) 20 MEQ tablet Take 1 tablet (20 mEq total) by mouth 2 (two) times daily. Patient not taking: Reported on 11/08/2015 08/31/15   Devoria AlbeIva Knapp, MD  psyllium (HYDROCIL/METAMUCIL) 95 % PACK Take 1 packet by mouth daily. Patient not taking: Reported on 11/08/2015 07/06/15   Emina Riebock, NP   BP 145/88 mmHg  Pulse 82  Temp(Src) 97.6 F (36.4 C) (Oral)  Resp 18  Ht 6\' 1"  (1.854 m)  Wt 79.379 kg  BMI 23.09 kg/m2  SpO2 100%   Physical Exam  Constitutional: He is oriented to person, place, and time. He appears well-developed and well-nourished. No distress.  HENT:  Head: Normocephalic and atraumatic.  Mouth/Throat: Oropharynx is clear and moist.  Mildly dry mucous membranes  Eyes: Conjunctivae and EOM are normal. Pupils are equal, round, and reactive to light.  Neck: Normal range of motion. Neck supple.  Cardiovascular: Normal rate, regular rhythm and normal heart sounds.   Pulmonary/Chest: Effort normal and breath sounds normal. No respiratory distress. He has no wheezes.  Abdominal: Soft. Bowel sounds are normal. There is no tenderness. There is no guarding.  Musculoskeletal: Normal range of motion. He exhibits no edema.  Neurological: He is alert and oriented to person, place, and time.  AAOx3, answering questions appropriately; equal strength UE and LE bilaterally; CN grossly intact; moves all extremities appropriately without ataxia; no focal neuro deficits or facial asymmetry appreciated  Skin: Skin is warm and dry. He is not diaphoretic.  Psychiatric: He has a normal mood and affect.  Nursing note and vitals reviewed.   ED Course  Procedures (including critical care time) Labs Review Labs Reviewed  COMPREHENSIVE METABOLIC PANEL - Abnormal; Notable for the following:    Potassium 3.4 (*)    Glucose, Bld 131 (*)    Total Protein 9.2 (*)    Albumin 5.1 (*)    Anion gap 17 (*)    All other components  within normal limits  CBC - Abnormal; Notable for the following:    WBC 11.7 (*)    All other components within normal limits  URINALYSIS, ROUTINE W REFLEX MICROSCOPIC (NOT AT [facility]) - Abnormal; Notable for the following:    Color, Urine AMBER (*)    APPearance CLOUDY (*)    Protein, ur 30 (*)    All other components within normal limits  URINE MICROSCOPIC-ADD ON - Abnormal; Notable for the following:    Squamous Epithelial / LPF 0-5 (*)    All other components within normal limits  LIPASE, BLOOD    Imaging Review No results found. I have personally reviewed and evaluated these images and lab results as part of my medical decision-making.   EKG Interpretation None      MDM   Final diagnoses:  Abdominal cramping  Nausea and vomiting, vomiting of unspecified type   29 year old male here with abdominal cramping, nausea, and vomiting. He reports he was expressing some paresthesia of his fingertips of bilateral hands earlier  today, however this resolved prior to arrival. He has concern for hypokalemia as he has a history of the same.  Patient is afebrile, nontoxic. He has no focal abdominal tenderness or peritoneal signs on exam. His mucous membranes are mildly dry.  Neurologically intact. Patient was given IV fluids, zofran, and bentyl. Labwork is reassuring, potassium is largely normal at 3.4. Patient is feeling better after IV fluids and medications. He was able to tolerate oral fluids without difficulty. He has had no active vomiting here in the emergency department.  He remains neurologically intact.  Patient is stable for discharge.  Rx bentyl and zofran for home, continue K+ supplements.  Encouraged fluids, gentle diet and progress back to normal as tolerated.  FU with PCP.  Discussed plan with patient, he/she acknowledged understanding and agreed with plan of care.  Return precautions given for new or worsening symptoms.  Garlon Hatchet, PA-C 11/08/15 1610  Alvira Monday,  MD 11/08/15 2140

## 2015-11-08 NOTE — ED Notes (Signed)
Pt able to tolerate PO fluids with no difficult or distress.

## 2015-11-08 NOTE — Discharge Instructions (Signed)
Take the prescribed medication as directed. Drink fluids to stay hydrated.  Start with bland diet and progress back to normal as tolerated. Follow-up with your primary care physician. Return to the ED for new or worsening symptoms.

## 2015-11-08 NOTE — ED Notes (Addendum)
Patient reports he started experiencing abd pain described as cramping with vomiting on Monday morning. Numbness and tingling started a couple of hours ago to his finger tips and radiating to upper arms. Patient reports his hands and arms are cramping from vomiting and not being able to keep his potassium down from vomiting.

## 2015-12-05 ENCOUNTER — Encounter (HOSPITAL_COMMUNITY): Payer: Self-pay | Admitting: Emergency Medicine

## 2015-12-05 ENCOUNTER — Emergency Department (HOSPITAL_COMMUNITY)
Admission: EM | Admit: 2015-12-05 | Discharge: 2015-12-06 | Disposition: A | Discharge status: Home / Self Care | Payer: Managed Care, Other (non HMO) | Attending: Emergency Medicine | Admitting: Emergency Medicine

## 2015-12-05 DIAGNOSIS — R112 Nausea with vomiting, unspecified: Secondary | ICD-10-CM

## 2015-12-05 DIAGNOSIS — Z792 Long term (current) use of antibiotics: Secondary | ICD-10-CM | POA: Insufficient documentation

## 2015-12-05 DIAGNOSIS — F12988 Cannabis use, unspecified with other cannabis-induced disorder: Secondary | ICD-10-CM

## 2015-12-05 DIAGNOSIS — Z8719 Personal history of other diseases of the digestive system: Secondary | ICD-10-CM | POA: Insufficient documentation

## 2015-12-05 DIAGNOSIS — Z8639 Personal history of other endocrine, nutritional and metabolic disease: Secondary | ICD-10-CM | POA: Diagnosis not present

## 2015-12-05 DIAGNOSIS — R1084 Generalized abdominal pain: Secondary | ICD-10-CM | POA: Diagnosis not present

## 2015-12-05 DIAGNOSIS — F12188 Cannabis abuse with other cannabis-induced disorder: Secondary | ICD-10-CM | POA: Insufficient documentation

## 2015-12-05 LAB — COMPREHENSIVE METABOLIC PANEL
ALBUMIN: 4.9 g/dL (ref 3.5–5.0)
ALT: 34 U/L (ref 17–63)
ANION GAP: 18 — AB (ref 5–15)
AST: 35 U/L (ref 15–41)
Alkaline Phosphatase: 66 U/L (ref 38–126)
BILIRUBIN TOTAL: 0.5 mg/dL (ref 0.3–1.2)
BUN: 12 mg/dL (ref 6–20)
CO2: 21 mmol/L — ABNORMAL LOW (ref 22–32)
Calcium: 9.7 mg/dL (ref 8.9–10.3)
Chloride: 99 mmol/L — ABNORMAL LOW (ref 101–111)
Creatinine, Ser: 1.03 mg/dL (ref 0.61–1.24)
GFR calc Af Amer: >60 mL/min (ref >60)
GFR calc non Af Amer: >60 mL/min (ref >60)
GLUCOSE: 129 mg/dL — AB (ref 65–99)
POTASSIUM: 3.2 mmol/L — AB (ref 3.5–5.1)
Sodium: 138 mmol/L (ref 135–145)
TOTAL PROTEIN: 8.7 g/dL — AB (ref 6.5–8.1)

## 2015-12-05 LAB — CBC
HEMATOCRIT: 46.6 % (ref 39.0–52.0)
HEMOGLOBIN: 15.7 g/dL (ref 13.0–17.0)
MCH: 30.4 pg (ref 26.0–34.0)
MCHC: 33.7 g/dL (ref 30.0–36.0)
MCV: 90.3 fL (ref 78.0–100.0)
Platelets: 194 10*3/uL (ref 150–400)
RBC: 5.16 MIL/uL (ref 4.22–5.81)
RDW: 12.9 % (ref 11.5–15.5)
WBC: 13.2 10*3/uL — AB (ref 4.0–10.5)

## 2015-12-05 LAB — LIPASE, BLOOD: LIPASE: 21 U/L (ref 11–51)

## 2015-12-05 MED ORDER — SODIUM CHLORIDE 0.9 % IV BOLUS (SEPSIS)
1000.0000 mL | Freq: Once | INTRAVENOUS | Status: AC
  Administered 2015-12-05: 1000 mL via INTRAVENOUS

## 2015-12-05 MED ORDER — GI COCKTAIL ~~LOC~~
30.0000 mL | Freq: Once | ORAL | Status: AC
  Administered 2015-12-05: 30 mL via ORAL
  Filled 2015-12-05: qty 30

## 2015-12-05 MED ORDER — POTASSIUM CHLORIDE CRYS ER 20 MEQ PO TBCR
40.0000 meq | EXTENDED_RELEASE_TABLET | Freq: Once | ORAL | Status: AC
  Administered 2015-12-05: 40 meq via ORAL
  Filled 2015-12-05: qty 2

## 2015-12-05 MED ORDER — PROCHLORPERAZINE EDISYLATE 5 MG/ML IJ SOLN
10.0000 mg | Freq: Once | INTRAMUSCULAR | Status: AC
  Administered 2015-12-05: 10 mg via INTRAVENOUS
  Filled 2015-12-05: qty 2

## 2015-12-05 MED ORDER — ONDANSETRON HCL 4 MG/2ML IJ SOLN
4.0000 mg | Freq: Once | INTRAMUSCULAR | Status: AC
  Administered 2015-12-05: 4 mg via INTRAVENOUS
  Filled 2015-12-05: qty 2

## 2015-12-05 MED ORDER — ONDANSETRON 4 MG PO TBDP: ORAL_TABLET | ORAL | Status: DC

## 2015-12-05 NOTE — ED Provider Notes (Signed)
CSN: [id number]     Arrival date & time 12/05/15  2117 History   First MD Initiated Contact with Patient 12/05/15 2157     Chief Complaint  Patient presents with  . Emesis  . Abdominal Pain     (Consider location/radiation/quality/duration/timing/severity/associated sxs/prior Treatment) Patient is a 30 y.o. male presenting with general illness. The history is provided by the patient.  Illness Severity:  Moderate Onset quality:  Gradual Duration:  1 day Timing:  Constant Progression:  Worsening Chronicity:  New Associated symptoms: abdominal pain (crampy diffuse), nausea and vomiting   Associated symptoms: no chest pain, no congestion, no diarrhea, no fever, no headaches, no myalgias, no rash and no shortness of breath    30 yo M with a chief complaint of nausea and vomiting. Is recurrent issue for this patient. No one has yet discovered the etiology for this. Patient denies any fevers or chills denies any bilious or bloody emesis. Diffuse abdominal cramping.  Past Medical History  Diagnosis Date  . Hemorrhoid   . Hypokalemia    Past Surgical History  Procedure Laterality Date  . Hemorrhoid surgery N/A 07/03/2015    Procedure: Exam under anesthesia, HEMORRHOIDECTOMY;  Surgeon: Avel Peace, MD;  Location: [facility];  Service: General;  Laterality: N/A;  . Hernia repair    . Wisdom tooth extraction     No family history on file. Social History  Substance Use Topics  . Smoking status: Never Smoker   . Smokeless tobacco: None  . Alcohol Use: No    Review of Systems  Constitutional: Negative for fever and chills.  HENT: Negative for congestion and facial swelling.   Eyes: Negative for discharge and visual disturbance.  Respiratory: Negative for shortness of breath.   Cardiovascular: Negative for chest pain and palpitations.  Gastrointestinal: Positive for nausea, vomiting and abdominal pain (crampy diffuse). Negative for diarrhea.  Musculoskeletal: Negative for myalgias  and arthralgias.  Skin: Negative for color change and rash.  Neurological: Negative for tremors, syncope and headaches.  Psychiatric/Behavioral: Negative for confusion and dysphoric mood.      Allergies  Review of patient's allergies indicates no known allergies.  Home Medications   Prior to Admission medications   Medication Sig Start Date End Date Taking? Authorizing Provider  amoxicillin-clavulanate (AUGMENTIN) 875-125 MG per tablet Take 1 tablet by mouth 2 (two) times daily. Patient not taking: Reported on 08/31/2015 07/06/15   Ashok Norris, NP  diazepam (VALIUM) 5 MG tablet Take 0.5 tablets (2.5 mg total) by mouth every 6 (six) hours as needed for muscle spasms. Patient not taking: Reported on 08/31/2015 07/06/15   Ashok Norris, NP  dicyclomine (BENTYL) 20 MG tablet Take 1 tablet (20 mg total) by mouth 2 (two) times daily. Patient not taking: Reported on 12/05/2015 11/08/15   Garlon Hatchet, PA-C  docusate sodium (COLACE) 100 MG capsule Take 1 capsule (100 mg total) by mouth 2 (two) times daily. Patient not taking: Reported on 11/08/2015 07/06/15   Ashok Norris, NP  ibuprofen (ADVIL,MOTRIN) 200 MG tablet Take 800 mg by mouth every 6 (six) hours as needed for moderate pain.    Historical Provider, MD  metroNIDAZOLE (FLAGYL) 500 MG tablet Take 1 tablet (500 mg total) by mouth 3 (three) times daily. Patient not taking: Reported on 08/31/2015 07/06/15   Ashok Norris, NP  ondansetron (ZOFRAN ODT) 4 MG disintegrating tablet  ODT q4 hours prn nausea/vomit 12/05/15   Melene Plan, DO  oxyCODONE (OXY IR/ROXICODONE) 5 MG immediate release tablet Take 1-2  tablets (5-10 mg total) by mouth every 4 (four) hours as needed for moderate pain or severe pain. Patient not taking: Reported on 08/31/2015 07/06/15   Ashok Norris, NP  polyethylene glycol (MIRALAX / GLYCOLAX) packet Take 17 g by mouth daily. Patient not taking: Reported on 08/31/2015 07/06/15   Ashok Norris, NP  potassium chloride SA  (K-DUR,KLOR-CON) 20 MEQ tablet Take 1 tablet (20 mEq total) by mouth 2 (two) times daily. Patient not taking: Reported on 11/08/2015 08/31/15   Devoria Albe, MD  psyllium (HYDROCIL/METAMUCIL) 95 % PACK Take 1 packet by mouth daily. Patient not taking: Reported on 11/08/2015 07/06/15   Emina Riebock, NP   BP 91/58 mmHg  Pulse 69  Temp(Src) 97.7 F (36.5 C)  Resp 14  SpO2 98% Physical Exam  Constitutional: He is oriented to person, place, and time. He appears well-developed and well-nourished.  HENT:  Head: Normocephalic and atraumatic.  Eyes: EOM are normal. Pupils are equal, round, and reactive to light.  Neck: Normal range of motion. Neck supple. No JVD present.  Cardiovascular: Normal rate and regular rhythm.  Exam reveals no gallop and no friction rub.   No murmur heard. Pulmonary/Chest: No respiratory distress. He has no wheezes.  Abdominal: He exhibits no distension. There is no tenderness. There is no rebound and no guarding.  Musculoskeletal: Normal range of motion.  Neurological: He is alert and oriented to person, place, and time.  Skin: No rash noted. No pallor.  Psychiatric: He has a normal mood and affect. His behavior is normal.  Nursing note and vitals reviewed.   ED Course  Procedures (including critical care time) Labs Review Labs Reviewed  COMPREHENSIVE METABOLIC PANEL - Abnormal; Notable for the following:    Potassium 3.2 (*)    Chloride 99 (*)    CO2 21 (*)    Glucose, Bld 129 (*)    Total Protein 8.7 (*)    Anion gap 18 (*)    All other components within normal limits  CBC - Abnormal; Notable for the following:    WBC 13.2 (*)    All other components within normal limits  LIPASE, BLOOD    Imaging Review No results found. I have personally reviewed and evaluated these images and lab results as part of my medical decision-making.   EKG Interpretation None      MDM   Final diagnoses:  Non-intractable vomiting with nausea, vomiting of unspecified  type  Cannabinoid hyperemesis syndrome (HCC)    30 yo M with a chief complaint of nausea and vomiting. Patient with strong odor of marijuana on entering the room. Suspect that this may be secondary to hyperemesis syndrome. We'll treat the patient with Zofran and IV fluids. Patient was initially able to tolerate a little bit of by mouth and then had a very small amount of emesis. Will give the patient a dose of Compazine. Reassess.   I have discussed the diagnosis/risks/treatment options with the patient and believe the pt to be eligible for discharge home to follow-up with PCP. We also discussed returning to the ED immediately if new or worsening sx occur. We discussed the sx which are most concerning (e.g., sudden worsening pain, fever, inability to tolerate by mouth) that necessitate immediate return. Medications administered to the patient during their visit and any new prescriptions provided to the patient are listed below.  Medications given during this visit Medications  sodium chloride 0.9 % bolus 1,000 mL (0 mLs Intravenous Stopped 12/05/15 2341)  ondansetron (ZOFRAN) injection  4 mg (4 mg Intravenous Given 12/05/15 2217)  gi cocktail (Maalox,Lidocaine,Donnatal) (30 mLs Oral Given 12/05/15 2217)  potassium chloride SA (K-DUR,KLOR-CON) CR tablet 40 mEq (40 mEq Oral Given 12/05/15 2341)  prochlorperazine (COMPAZINE) injection 10 mg (10 mg Intravenous Given 12/05/15 2340)  sodium chloride 0.9 % bolus 1,000 mL (0 mLs Intravenous Stopped 12/06/15 0051)    Discharge Medication List as of 12/05/2015 11:45 PM      The patient appears reasonably screen and/or stabilized for discharge and I doubt any other medical condition or other [facility] requiring further screening, evaluation, or treatment in the ED at this time prior to discharge.    Melene Plan, DO 12/06/15 1643

## 2015-12-05 NOTE — Discharge Instructions (Signed)
Cannabis Use Disorder Cannabis use disorder is a mental disorder. It is not one-time or occasional use of cannabis, more commonly known as marijuana. Cannabis use disorder is the continued, nonmedical use of cannabis that interferes with normal life activities or causes health problems. People with cannabis use disorder get a feeling of extreme pleasure and relaxation from cannabis use. This "high" is very rewarding and causes people to use over and over.  The mind-altering ingredient in cannabis is know as THC. THC can also interfere with motor coordination, memory, judgment, and accurate sense of space and time. These effects can last for a few days after using cannabis. Regular heavy cannabis use can cause long-lasting problems with thinking and learning. In young people, these problems may be permanent. Cannabis sometimes causes severe anxiety, paranoia, or visual hallucinations. Man-made (synthetic) cannabis-like drugs, such as "spice" and "K2," cause the same effects as THC but are much stronger. Cannabis-like drugs can cause dangerously high blood pressure and heart rate.  Cannabis use disorder usually starts in the teenage years. It can trigger the development of schizophrenia. It is somewhat more common in men than women. People who have family members with the disorder or existing mental health issues such as depression and posttraumatic stress disorderare more likely to develop cannabis use disorder. People with cannabis use disorder are at higher risk for use of other drugs of abuse.  SIGNS AND SYMPTOMS Signs and symptoms of cannabis use disorder include:   Use of cannabis in larger amounts or over a longer period than intended.   Unsuccessful attempts to cut down or control cannabis use.   A lot of time spent obtaining, using, or recovering from the effects of cannabis.   A strong desire or urge to use cannabis (cravings).   Continued use of cannabis in spite of problems at work,  school, or home because of use.   Continued use of cannabis in spite of relationship problems because of use.  Giving up or cutting down on important life activities because of cannabis use.  Use of cannabis over and over even in situations when it is physically hazardous, such as when driving a car.   Continued use of cannabis in spite of a physical problem that is likely related to use. Physical problems can include:  Chronic cough.  Bronchitis.  Emphysema.  Throat and lung cancer.  Continued use of cannabis in spite of a mental problem that is likely related to use. Mental problems can include:  Psychosis.  Anxiety.  Difficulty sleeping.  Need to use more and more cannabis to get the same effect, or lessened effect over time with use of the same amount (tolerance).  Having withdrawal symptoms when cannabis use is stopped, or using cannabis to reduce or avoid withdrawal symptoms. Withdrawal symptoms include:  Irritability or anger.  Anxiety or restlessness.  Difficulty sleeping.  Loss of appetite or weight.  Aches and pains.  Shakiness.  Sweating.  Chills. DIAGNOSIS Cannabis use disorder is diagnosed by your health care provider. You may be asked questions about your cannabis use and how it affects your life. A physical exam may be done. A drug screen may be done. You may be referred to a mental health professional. The diagnosis of cannabis use disorder requires at least two symptoms within 12 months. The type of cannabis use disorder you have depends on the number of symptoms you have. The type may be:  Mild. Two or three signs and symptoms.   Moderate. Four or   five signs and symptoms.   Severe. Six or more signs and symptoms.  TREATMENT Treatment is usually provided by mental health professionals with training in substance use disorders. The following options are available:  Counseling or talk therapy. Talk therapy addresses the reasons you use  cannabis. It also addresses ways to keep you from using again. The goals of talk therapy include:  Identifying and avoiding triggers for use.  Learning how to handle cravings.  Replacing use with healthy activities.  Support groups. Support groups provide emotional support, advice, and guidance.  Medicine. Medicine is used to treat mental health issues that trigger cannabis use or that result from it. HOME CARE INSTRUCTIONS  Take medicines only as directed by your health care provider.  Check with your health care provider before starting any new medicines.  Keep all follow-up visits as directed by your health care provider. SEEK MEDICAL CARE IF:  You are not able to take your medicines as directed.  Your symptoms get worse. SEEK IMMEDIATE MEDICAL CARE IF: You have serious thoughts about hurting yourself or others. FOR MORE INFORMATION  National Institute on Drug Abuse: www.drugabuse.gov  Substance Abuse and Mental Health Services Administration: www.samhsa.gov   This information is not intended to replace advice given to you by your health care provider. Make sure you discuss any questions you have with your health care provider.   Document Released: 10/25/2000 Document Revised: 11/18/2014 Document Reviewed: 11/10/2013 Elsevier Interactive Patient Education 2016 Elsevier Inc.  

## 2015-12-05 NOTE — ED Notes (Signed)
Patient reports eating doughnut earlier today and has had 15+ episodes of emesis. History of hypokalemia. Fingers appear to be contracted but patient witnessed opening and closing hands independently, states this normally happens with excessive emesis.

## 2016-01-27 DIAGNOSIS — R1084 Generalized abdominal pain: Secondary | ICD-10-CM | POA: Insufficient documentation

## 2016-01-27 DIAGNOSIS — R231 Pallor: Secondary | ICD-10-CM | POA: Insufficient documentation

## 2016-01-27 DIAGNOSIS — R197 Diarrhea, unspecified: Secondary | ICD-10-CM | POA: Insufficient documentation

## 2016-01-27 DIAGNOSIS — R112 Nausea with vomiting, unspecified: Secondary | ICD-10-CM | POA: Diagnosis not present

## 2016-01-27 DIAGNOSIS — E876 Hypokalemia: Secondary | ICD-10-CM | POA: Diagnosis not present

## 2016-01-27 DIAGNOSIS — Z79899 Other long term (current) drug therapy: Secondary | ICD-10-CM | POA: Insufficient documentation

## 2016-01-27 DIAGNOSIS — Z8719 Personal history of other diseases of the digestive system: Secondary | ICD-10-CM | POA: Diagnosis not present

## 2016-01-28 ENCOUNTER — Encounter (HOSPITAL_COMMUNITY): Payer: Self-pay | Admitting: Emergency Medicine

## 2016-01-28 ENCOUNTER — Emergency Department (HOSPITAL_COMMUNITY)
Admission: EM | Admit: 2016-01-28 | Discharge: 2016-01-28 | Disposition: A | Discharge status: Home / Self Care | Payer: Managed Care, Other (non HMO) | Attending: Emergency Medicine | Admitting: Emergency Medicine

## 2016-01-28 DIAGNOSIS — R112 Nausea with vomiting, unspecified: Secondary | ICD-10-CM

## 2016-01-28 LAB — URINALYSIS, ROUTINE W REFLEX MICROSCOPIC
Bilirubin Urine: NEGATIVE
GLUCOSE, UA: NEGATIVE mg/dL
HGB URINE DIPSTICK: NEGATIVE
Ketones, ur: >80 mg/dL — AB
Leukocytes, UA: NEGATIVE
Nitrite: NEGATIVE
PROTEIN: 30 mg/dL — AB
Specific Gravity, Urine: 1.035 — ABNORMAL HIGH (ref 1.005–1.030)
pH: 8.5 — ABNORMAL HIGH (ref 5.0–8.0)

## 2016-01-28 LAB — COMPREHENSIVE METABOLIC PANEL
ALT: 41 U/L (ref 17–63)
AST: 35 U/L (ref 15–41)
Albumin: 5 g/dL (ref 3.5–5.0)
Alkaline Phosphatase: 75 U/L (ref 38–126)
Anion gap: 15 (ref 5–15)
BUN: 20 mg/dL (ref 6–20)
CHLORIDE: 103 mmol/L (ref 101–111)
CO2: 22 mmol/L (ref 22–32)
CREATININE: 1.15 mg/dL (ref 0.61–1.24)
Calcium: 10.1 mg/dL (ref 8.9–10.3)
GFR calc Af Amer: >60 mL/min (ref >60)
GFR calc non Af Amer: >60 mL/min (ref >60)
GLUCOSE: 167 mg/dL — AB (ref 65–99)
Potassium: 3.9 mmol/L (ref 3.5–5.1)
SODIUM: 140 mmol/L (ref 135–145)
Total Bilirubin: 1.1 mg/dL (ref 0.3–1.2)
Total Protein: 8.9 g/dL — ABNORMAL HIGH (ref 6.5–8.1)

## 2016-01-28 LAB — URINE MICROSCOPIC-ADD ON: WBC, UA: NONE SEEN WBC/hpf (ref 0–5)

## 2016-01-28 LAB — CBC
HCT: 49.4 % (ref 39.0–52.0)
Hemoglobin: 16.8 g/dL (ref 13.0–17.0)
MCH: 30.5 pg (ref 26.0–34.0)
MCHC: 34 g/dL (ref 30.0–36.0)
MCV: 89.8 fL (ref 78.0–100.0)
PLATELETS: 203 10*3/uL (ref 150–400)
RBC: 5.5 MIL/uL (ref 4.22–5.81)
RDW: 13.2 % (ref 11.5–15.5)
WBC: 14 10*3/uL — ABNORMAL HIGH (ref 4.0–10.5)

## 2016-01-28 LAB — LIPASE, BLOOD: LIPASE: 17 U/L (ref 11–51)

## 2016-01-28 MED ORDER — DICYCLOMINE HCL 10 MG/ML IM SOLN
20.0000 mg | Freq: Once | INTRAMUSCULAR | Status: AC
  Administered 2016-01-28: 20 mg via INTRAMUSCULAR
  Filled 2016-01-28: qty 2

## 2016-01-28 MED ORDER — ONDANSETRON 4 MG PO TBDP
4.0000 mg | ORAL_TABLET | Freq: Once | ORAL | Status: AC | PRN
  Administered 2016-01-28: 4 mg via ORAL
  Filled 2016-01-28: qty 1

## 2016-01-28 MED ORDER — KETOROLAC TROMETHAMINE 30 MG/ML IJ SOLN
30.0000 mg | Freq: Once | INTRAMUSCULAR | Status: AC
  Administered 2016-01-28: 30 mg via INTRAVENOUS
  Filled 2016-01-28: qty 1

## 2016-01-28 MED ORDER — ONDANSETRON HCL 4 MG/2ML IJ SOLN
4.0000 mg | Freq: Once | INTRAMUSCULAR | Status: AC
  Administered 2016-01-28: 4 mg via INTRAVENOUS
  Filled 2016-01-28: qty 2

## 2016-01-28 MED ORDER — ONDANSETRON 4 MG PO TBDP: 4.0000 mg | ORAL_TABLET | Freq: Three times a day (TID) | ORAL | Status: DC | PRN

## 2016-01-28 MED ORDER — SODIUM CHLORIDE 0.9 % IV BOLUS (SEPSIS)
1000.0000 mL | Freq: Once | INTRAVENOUS | Status: AC
  Administered 2016-01-28: 1000 mL via INTRAVENOUS

## 2016-01-28 MED ORDER — SODIUM CHLORIDE 0.9 % IV BOLUS (SEPSIS)
500.0000 mL | Freq: Once | INTRAVENOUS | Status: AC
  Administered 2016-01-28: 500 mL via INTRAVENOUS

## 2016-01-28 NOTE — ED Notes (Addendum)
Pt from home with complaints of n/v/d and chills. Pt states this has been going on for about 5 hours. Pt states "he lost count" of how many times he threw up and had diarrhea in the past 24 hours. Pt states he ate [place] food earlier in the day. Pt states he has lower left sided abdominal pain that just started recently.

## 2016-01-28 NOTE — Discharge Instructions (Signed)
Take your medications as prescribed as needed for nausea and vomiting. I recommend refraining from smoking marijuana. Drink six 8 ounce glasses of water daily to remain hydrated. Follow-up with your primary care provider in 3-4 days. Please return to the Emergency Department if symptoms worsen or new onset of fever, abdominal pain, uncontrollable vomiting, vomiting blood, unable to keep fluids down.

## 2016-01-28 NOTE — ED Provider Notes (Signed)
CSN: [id number]     Arrival date & time 01/27/16  2343 History   First MD Initiated Contact with Patient 01/28/16 0111     Chief Complaint  Patient presents with  . Emesis  . Diarrhea     (Consider location/radiation/quality/duration/timing/severity/associated sxs/prior Treatment) HPI Comments: This 30 year old with frequent episodes of nausea and vomiting.  This is been related to his cannabis use in the past.  He states that he has not had any diarrhea.  His symptoms started about 5 hours ago.  He feels very "dehydrated"  Patient is a 30 y.o. male presenting with vomiting and diarrhea. The history is provided by the patient.  Emesis Severity:  Moderate Timing:  Intermittent Quality:  Bilious material Progression:  Unchanged Chronicity:  Recurrent Recent urination:  Decreased Associated symptoms: abdominal pain   Associated symptoms: no diarrhea   Diarrhea Associated symptoms: abdominal pain and vomiting   Associated symptoms: no fever     Past Medical History  Diagnosis Date  . Hemorrhoid   . Hypokalemia    Past Surgical History  Procedure Laterality Date  . Hemorrhoid surgery N/A 07/03/2015    Procedure: Exam under anesthesia, HEMORRHOIDECTOMY;  Surgeon: Avel Peaceodd Rosenbower, MD;  Location: [facility];  Service: General;  Laterality: N/A;  . Hernia repair    . Wisdom tooth extraction     No family history on file. Social History  Substance Use Topics  . Smoking status: Never Smoker   . Smokeless tobacco: None  . Alcohol Use: No    Review of Systems  Constitutional: Negative for fever.  Respiratory: Negative for cough.   Gastrointestinal: Positive for nausea, vomiting and abdominal pain. Negative for diarrhea.  All other systems reviewed and are negative.     Allergies  Review of patient's allergies indicates no known allergies.  Home Medications   Prior to Admission medications   Medication Sig Start Date End Date Taking? Authorizing Provider  ibuprofen  (ADVIL,MOTRIN) 200 MG tablet Take 800 mg by mouth every 6 (six) hours as needed for moderate pain.   Yes Historical Provider, MD  potassium chloride SA (K-DUR,KLOR-CON) 20 MEQ tablet Take 1 tablet (20 mEq total) by mouth 2 (two) times daily. 08/31/15  Yes Devoria AlbeIva Knapp, MD  amoxicillin-clavulanate (AUGMENTIN) 875-125 MG per tablet Take 1 tablet by mouth 2 (two) times daily. Patient not taking: Reported on 08/31/2015 07/06/15   Ashok NorrisEmina Riebock, NP  diazepam (VALIUM) 5 MG tablet Take 0.5 tablets (2.5 mg total) by mouth every 6 (six) hours as needed for muscle spasms. Patient not taking: Reported on 08/31/2015 07/06/15   Ashok NorrisEmina Riebock, NP  dicyclomine (BENTYL) 20 MG tablet Take 1 tablet (20 mg total) by mouth 2 (two) times daily. Patient not taking: Reported on 12/05/2015 11/08/15   Garlon HatchetLisa M Sanders, PA-C  docusate sodium (COLACE) 100 MG capsule Take 1 capsule (100 mg total) by mouth 2 (two) times daily. Patient not taking: Reported on 11/08/2015 07/06/15   Ashok NorrisEmina Riebock, NP  metroNIDAZOLE (FLAGYL) 500 MG tablet Take 1 tablet (500 mg total) by mouth 3 (three) times daily. Patient not taking: Reported on 08/31/2015 07/06/15   Emina Riebock, NP  ondansetron (ZOFRAN ODT) 4 MG disintegrating tablet Take 1 tablet (4 mg total) by mouth every 8 (eight) hours as needed for nausea or vomiting. 01/28/16   Barrett HenleNicole Elizabeth Nadeau, PA-C  oxyCODONE (OXY IR/ROXICODONE) 5 MG immediate release tablet Take 1-2 tablets (5-10 mg total) by mouth every 4 (four) hours as needed for moderate pain or severe pain.  Patient not taking: Reported on 08/31/2015 07/06/15   Ashok Norris, NP  polyethylene glycol (MIRALAX / GLYCOLAX) packet Take 17 g by mouth daily. Patient not taking: Reported on 08/31/2015 07/06/15   Emina Riebock, NP  psyllium (HYDROCIL/METAMUCIL) 95 % PACK Take 1 packet by mouth daily. Patient not taking: Reported on 11/08/2015 07/06/15   Ashok Norris, NP   BP 112/81 mmHg  Pulse 77  Temp(Src) 97.8 F (36.6 C) (Oral)  Resp  17  SpO2 97% Physical Exam  Constitutional: He appears well-developed and well-nourished.  HENT:  Head: Normocephalic.  Eyes: Pupils are equal, round, and reactive to light.  Neck: Normal range of motion.  Cardiovascular: Normal rate and regular rhythm.   Pulmonary/Chest: Effort normal.  Abdominal: Soft. He exhibits no distension. There is generalized tenderness.  Musculoskeletal: Normal range of motion.  Neurological: He is alert.  Skin: Skin is warm. There is pallor.  Nursing note and vitals reviewed.   ED Course  Procedures (including critical care time) Labs Review Labs Reviewed  COMPREHENSIVE METABOLIC PANEL - Abnormal; Notable for the following:    Glucose, Bld 167 (*)    Total Protein 8.9 (*)    All other components within normal limits  CBC - Abnormal; Notable for the following:    WBC 14.0 (*)    All other components within normal limits  URINALYSIS, ROUTINE W REFLEX MICROSCOPIC (NOT AT [facility]) - Abnormal; Notable for the following:    Color, Urine AMBER (*)    Specific Gravity, Urine 1.035 (*)    pH 8.5 (*)    Ketones, ur >80 (*)    Protein, ur 30 (*)    All other components within normal limits  URINE MICROSCOPIC-ADD ON - Abnormal; Notable for the following:    Squamous Epithelial / LPF 0-5 (*)    Bacteria, UA RARE (*)    All other components within normal limits  LIPASE, BLOOD    Imaging Review No results found. I have personally reviewed and evaluated these images and lab results as part of my medical decision-making.   EKG Interpretation None    Patient has normal potassium, and white count.  Creatinine is slightly elevated at 1.15.  He is spilling ketones in his urine.  He will be given IV fluid since being given Zofran, Bentyl and Toradol is not having any more episodes of vomiting.  He's not had any complaints of abdominal discomfort  MDM   Final diagnoses:  Non-intractable vomiting with nausea, vomiting of unspecified type         Earley Favor, NP 01/28/16 2005  Geoffery Lyons, MD 01/29/16 [id number]

## 2016-01-28 NOTE — ED Provider Notes (Signed)
  Patient is a 30 year old male with no pertinent past medical history presents the ED with complaint of nausea and vomiting, onset 5 hours prior to arrival. Patient has been seen in the ED for similar symptoms associated with cannabis use. Denies fever, chills, diarrhea, cough, shortness of breath, chest pain.  Physical Exam  BP 112/81 mmHg  Pulse 77  Temp(Src) 97.8 F (36.6 C) (Oral)  Resp 17  SpO2 97%  Physical Exam  Constitutional: He is oriented to person, place, and time. He appears well-developed and well-nourished.  HENT:  Head: Normocephalic and atraumatic.  Mouth/Throat: Oropharynx is clear and moist. No oropharyngeal exudate.  Eyes: Conjunctivae and EOM are normal. Right eye exhibits no discharge. Left eye exhibits no discharge. No scleral icterus.  Neck: Normal range of motion. Neck supple.  Cardiovascular: Normal rate, regular rhythm, normal heart sounds and intact distal pulses.   Pulmonary/Chest: Effort normal and breath sounds normal. No respiratory distress. He has no wheezes. He has no rales. He exhibits no tenderness.  Abdominal: Soft. Bowel sounds are normal. He exhibits no distension and no mass. There is no tenderness. There is no rebound and no guarding.  Musculoskeletal: He exhibits no edema.  Neurological: He is alert and oriented to person, place, and time.  Skin: Skin is warm and dry.  Nursing note and vitals reviewed.   ED Course  Procedures  MDM  Patient presents with nausea vomiting. He has been seen in the ED multiple times in the past for cannabis use. Initial exam revealed mild diffuse generalized abdominal tenderness. Patient given Zofran, Bentyl, Toradol and IV fluids. Creatinine slightly elevated out 1.15. Ketones in urine. Patient has not had any episodes of vomiting while in the ED.  6:00am- Hand-off from Earley FavorGail Schulz, NP. Plan to give patient IV fluids and by mouth challenge prior to discharge.  My initial evaluation, exam unremarkable. Patient  denies any episodes of vomiting but reports still feeling mildly nauseous. Will give pt second dose of IV Zofran and plan to finish patient's IV fluids and by mouth challenge in the ED.  8:30- Pt reports having one episode of vomiting prior to receiving his Zofran. Pt given second dose of Zofran at 8:38.  On reevaluation patient reports his nausea has improved. Patient able tolerate by mouth in the ED. Discussed results and plan for discharge with patient. Patient discharged home with Zofran and advised to follow-up with his PCP.  Evaluation does not show pathology requring ongoing emergent intervention or admission. Pt is hemodynamically stable and mentating appropriately. Discussed findings/results and plan with patient/guardian, who agrees with plan. All questions answered. Return precautions discussed and outpatient follow up given.        Satira Sarkicole Elizabeth Silver CityNadeau, [place] JerseyPA-C 01/28/16 1042  Geoffery Lyonsouglas Delo, MD 01/29/16 [phone]

## 2017-01-21 ENCOUNTER — Encounter (HOSPITAL_BASED_OUTPATIENT_CLINIC_OR_DEPARTMENT_OTHER): Payer: Self-pay | Admitting: *Deleted

## 2017-01-21 ENCOUNTER — Emergency Department (HOSPITAL_BASED_OUTPATIENT_CLINIC_OR_DEPARTMENT_OTHER)
Admission: EM | Admit: 2017-01-21 | Discharge: 2017-01-21 | Disposition: A | Discharge status: Home / Self Care | Payer: Managed Care, Other (non HMO) | Attending: Emergency Medicine | Admitting: Emergency Medicine

## 2017-01-21 DIAGNOSIS — R1031 Right lower quadrant pain: Secondary | ICD-10-CM | POA: Diagnosis present

## 2017-01-21 DIAGNOSIS — K4090 Unilateral inguinal hernia, without obstruction or gangrene, not specified as recurrent: Secondary | ICD-10-CM | POA: Diagnosis not present

## 2017-01-21 MED ORDER — IBUPROFEN 400 MG PO TABS
600.0000 mg | ORAL_TABLET | Freq: Once | ORAL | Status: AC
  Administered 2017-01-21: 600 mg via ORAL
  Filled 2017-01-21: qty 1

## 2017-01-21 NOTE — ED Provider Notes (Signed)
[facility] Provider Note   CSN: [id number] Arrival date & time: 01/21/17  1706   By signing my name below, I, Clarisse GougeXavier Herndon, attest that this documentation has been prepared under the direction and in the presence of Francisco Espina, [place]. Electronically Signed: Clarisse GougeXavier Herndon, Scribe. 01/21/17. 8:55 PM.   History   Chief Complaint Chief Complaint  Patient presents with  . Abdominal Pain   The history is provided by the patient and medical records. No language interpreter was used.    HPI Comments: Bruce Shelton is a 31 y.o. male with Hx of hemorrhoid surgery who presents to the Emergency Department complaining of worsening intermittent RLQ pain x 3 days. Pt describes the pain as pressure, occasionally sharp and radiating to the right hip, leg and pelvis. He notes 6/10 pain at rest and 7/10 pain while walking, and he adds his pain is worsened with bending over and leaning forward. He states he was hit in the area 2 days ago while playing basketball and he adds worsening pain since. Pt reports associated bulging to the area. He states he has noticed bulging to the area for "several months" now but never had this pain. He states he has taken ibuprofen and rested without relief. Hx of hernia repair noted as an infant, pt unsure of the location. Pt denies constipation, diarrhea, appetite change, dietary changes, N/V/D, fever, chills or bloody stool.  Past Medical History:  Diagnosis Date  . Hemorrhoid   . Hypokalemia     Patient Active Problem List   Diagnosis Date Noted  . Dehydration 07/02/2015  . AKI (acute kidney injury) (HCC) 07/02/2015  . Hemorrhoid     Past Surgical History:  Procedure Laterality Date  . HEMORRHOID SURGERY N/A 07/03/2015   Procedure: Exam under anesthesia, HEMORRHOIDECTOMY;  Surgeon: Avel Peaceodd Rosenbower, MD;  Location: [facility];  Service: General;  Laterality: N/A;  . HERNIA REPAIR    . WISDOM TOOTH EXTRACTION         Home Medications     Prior to Admission medications   Medication Sig Start Date End Date Taking? Authorizing Provider  amoxicillin-clavulanate (AUGMENTIN) 875-125 MG per tablet Take 1 tablet by mouth 2 (two) times daily. Patient not taking: Reported on 08/31/2015 07/06/15   Ashok NorrisEmina Riebock, NP  diazepam (VALIUM) 5 MG tablet Take 0.5 tablets (2.5 mg total) by mouth every 6 (six) hours as needed for muscle spasms. Patient not taking: Reported on 08/31/2015 07/06/15   Ashok NorrisEmina Riebock, NP  dicyclomine (BENTYL) 20 MG tablet Take 1 tablet (20 mg total) by mouth 2 (two) times daily. Patient not taking: Reported on 12/05/2015 11/08/15   Garlon HatchetLisa M Sanders, PA-C  docusate sodium (COLACE) 100 MG capsule Take 1 capsule (100 mg total) by mouth 2 (two) times daily. Patient not taking: Reported on 11/08/2015 07/06/15   Ashok NorrisEmina Riebock, NP  ibuprofen (ADVIL,MOTRIN) 200 MG tablet Take 800 mg by mouth every 6 (six) hours as needed for moderate pain.    Historical Provider, MD  metroNIDAZOLE (FLAGYL) 500 MG tablet Take 1 tablet (500 mg total) by mouth 3 (three) times daily. Patient not taking: Reported on 08/31/2015 07/06/15   Emina Riebock, NP  ondansetron (ZOFRAN ODT) 4 MG disintegrating tablet Take 1 tablet (4 mg total) by mouth every 8 (eight) hours as needed for nausea or vomiting. 01/28/16   Barrett HenleNicole Elizabeth Nadeau, PA-C  oxyCODONE (OXY IR/ROXICODONE) 5 MG immediate release tablet Take 1-2 tablets (5-10 mg total) by mouth every 4 (four) hours as needed for  moderate pain or severe pain. Patient not taking: Reported on 08/31/2015 07/06/15   Ashok Norris, NP  polyethylene glycol (MIRALAX / GLYCOLAX) packet Take 17 g by mouth daily. Patient not taking: Reported on 08/31/2015 07/06/15   Ashok Norris, NP  potassium chloride SA (K-DUR,KLOR-CON) 20 MEQ tablet Take 1 tablet (20 mEq total) by mouth 2 (two) times daily. 08/31/15   Devoria Albe, MD  psyllium (HYDROCIL/METAMUCIL) 95 % PACK Take 1 packet by mouth daily. Patient not taking: Reported on  11/08/2015 07/06/15   Ashok Norris, NP    Family History No family history on file.  Social History Social History  Substance Use Topics  . Smoking status: Never Smoker  . Smokeless tobacco: Never Used  . Alcohol use No     Allergies   Patient has no known allergies.   Review of Systems Review of Systems  Constitutional: Negative for activity change, appetite change, chills and fever.  Gastrointestinal: Positive for abdominal distention and abdominal pain. Negative for blood in stool, constipation, diarrhea, nausea and vomiting.  Genitourinary: Negative for decreased urine volume, dysuria, enuresis, hematuria, scrotal swelling, testicular pain and urgency.  Skin: Negative for wound.     Physical Exam Updated Vital Signs BP 125/95   Pulse (!) 59   Temp 98.1 F (36.7 C) (Oral)   Resp 20   Ht 6\' 1"  (1.854 m)   Wt 175 lb (79.4 kg)   SpO2 99%   BMI 23.09 kg/m   Physical Exam  Constitutional: He is oriented to person, place, and time. He appears well-developed and well-nourished.  Well appearing  HENT:  Head: Normocephalic and atraumatic.  Eyes: EOM are normal. Pupils are equal, round, and reactive to light.  Neck: Normal range of motion. Neck supple. No JVD present.  Cardiovascular: Normal rate, regular rhythm, normal heart sounds and intact distal pulses.  Exam reveals no gallop and no friction rub.   No murmur heard. Pulmonary/Chest: Effort normal and breath sounds normal. No respiratory distress. He has no wheezes.  Abdominal: Soft. He exhibits no distension. There is tenderness in the right lower quadrant. There is no rebound, no guarding and no CVA tenderness. Hernia confirmed negative in the right inguinal area and confirmed negative in the left inguinal area.  Noticeable bulge to the R inguinal area. No redness, +TTP  Genitourinary: Testes normal and penis normal. Cremasteric reflex is present. Right testis shows no tenderness. Left testis shows no tenderness.    Genitourinary Comments: Chaperone present: penis non-tender, no swelling or tenderness to the scrotum, no hernia noted on right or left inguinal canal. Testicles nontender to palpation.   Musculoskeletal: Normal range of motion.  Neurological: He is alert and oriented to person, place, and time.  Skin: No rash noted. No pallor.  Psychiatric: He has a normal mood and affect. His behavior is normal.  Nursing note and vitals reviewed.    ED Treatments / Results  DIAGNOSTIC STUDIES: Oxygen Saturation is 99% on RA, normal by my interpretation.    COORDINATION OF CARE: 8:55 PM Discussed treatment plan with pt at bedside and pt agreed to plan. Will reduce inguinal bulge, order medication and reassess. 10:00 PM Pt reassessed: bulge reduced and pt prepared for discharge.  Labs (all labs ordered are listed, but only abnormal results are displayed) Labs Reviewed - No data to display  EKG  EKG Interpretation None       Radiology No results found.  Procedures Procedures (including critical care time)  Medications Ordered in ED Medications  ibuprofen (ADVIL,MOTRIN) tablet 600 mg (600 mg Oral Given 01/21/17 2107)     Initial Impression / Assessment and Plan / ED Course  I have reviewed the triage vital signs and the nursing notes.  Pertinent labs & imaging results that were available during my care of the patient were reviewed by me and considered in my medical decision making (see chart for details).     Hernia is reducible without difficulty. No indication of incarcerated or strangulated hernia. Abdomen soft and nontender without peritoneal signs. Patient without nausea vomiting, diarrhea, rectal bleeding. Patient is afebrile, non-tachycardic, alert, oriented and nontoxic appearing. Recommend patient follow-up with primary care physician and general surgery for discussion of hernia repair if symptoms worsen. Pt case discussed with Dr. Ranae Palms who agreed with assessment and plan.   . I personally performed the services described in this documentation, which was scribed in my presence. The recorded information has been reviewed and is accurate.  Final Clinical Impressions(s) / ED Diagnoses   Final diagnoses:  Unilateral inguinal hernia without obstruction or gangrene, recurrence not specified    New Prescriptions Discharge Medication List as of 01/21/2017 10:19 PM       [address] [place], [place] 01/22/17 [address] [place], [place] 01/22/17 0254    Loren Racer, MD 01/24/17 [phone]

## 2017-01-21 NOTE — ED Triage Notes (Addendum)
Pain in his right lower abdomen for many months. Worse after getting hit in the same area with a basketball 2 days ago. Bulge at his right inguinal region.

## 2017-01-21 NOTE — Discharge Instructions (Signed)
[facility]. Use ice compresses at home and make sure to reduce hernia whenever you see and bulge out. Take ibuProfen or Tylenol as needed for pain.  Contact a health care provider if: You have swelling, redness, and pain in the affected area. Your bowel habits change. Get help right away if: You have a fever. You have abdominal pain that is getting worse. You feel nauseous or you vomit. You cannot push the hernia back in place by gently pressing on it while you are lying down. The hernia: Changes in shape or size. Is stuck outside the abdomen. Becomes discolored. Feels hard or tender.

## 2017-12-14 ENCOUNTER — Emergency Department (HOSPITAL_COMMUNITY)
Admission: EM | Admit: 2017-12-14 | Discharge: 2017-12-14 | Disposition: A | Discharge status: Home / Self Care | Payer: Managed Care, Other (non HMO) | Source: Home / Self Care | Attending: Emergency Medicine | Admitting: Emergency Medicine

## 2017-12-14 ENCOUNTER — Encounter (HOSPITAL_COMMUNITY): Payer: Self-pay | Admitting: *Deleted

## 2017-12-14 ENCOUNTER — Other Ambulatory Visit: Payer: Self-pay

## 2017-12-14 ENCOUNTER — Emergency Department (HOSPITAL_COMMUNITY): Payer: Managed Care, Other (non HMO)

## 2017-12-14 DIAGNOSIS — R112 Nausea with vomiting, unspecified: Secondary | ICD-10-CM

## 2017-12-14 DIAGNOSIS — R109 Unspecified abdominal pain: Secondary | ICD-10-CM | POA: Insufficient documentation

## 2017-12-14 DIAGNOSIS — R197 Diarrhea, unspecified: Principal | ICD-10-CM

## 2017-12-14 DIAGNOSIS — R1030 Lower abdominal pain, unspecified: Secondary | ICD-10-CM | POA: Insufficient documentation

## 2017-12-14 LAB — CBC
HEMATOCRIT: 49.6 % (ref 39.0–52.0)
Hemoglobin: 17.5 g/dL — ABNORMAL HIGH (ref 13.0–17.0)
MCH: 31.1 pg (ref 26.0–34.0)
MCHC: 35.3 g/dL (ref 30.0–36.0)
MCV: 88.3 fL (ref 78.0–100.0)
Platelets: 205 10*3/uL (ref 150–400)
RBC: 5.62 MIL/uL (ref 4.22–5.81)
RDW: 12.8 % (ref 11.5–15.5)
WBC: 12.8 10*3/uL — AB (ref 4.0–10.5)

## 2017-12-14 LAB — COMPREHENSIVE METABOLIC PANEL
ALT: 34 U/L (ref 17–63)
AST: 35 U/L (ref 15–41)
Albumin: 5 g/dL (ref 3.5–5.0)
Alkaline Phosphatase: 71 U/L (ref 38–126)
Anion gap: 11 (ref 5–15)
BUN: 18 mg/dL (ref 6–20)
CO2: 23 mmol/L (ref 22–32)
Calcium: 10.3 mg/dL (ref 8.9–10.3)
Chloride: 104 mmol/L (ref 101–111)
Creatinine, Ser: 0.94 mg/dL (ref 0.61–1.24)
Glucose, Bld: 120 mg/dL — ABNORMAL HIGH (ref 65–99)
POTASSIUM: 3.9 mmol/L (ref 3.5–5.1)
SODIUM: 138 mmol/L (ref 135–145)
Total Bilirubin: 0.9 mg/dL (ref 0.3–1.2)
Total Protein: 9.4 g/dL — ABNORMAL HIGH (ref 6.5–8.1)

## 2017-12-14 LAB — URINALYSIS, ROUTINE W REFLEX MICROSCOPIC
Bilirubin Urine: NEGATIVE
GLUCOSE, UA: NEGATIVE mg/dL
Hgb urine dipstick: NEGATIVE
Ketones, ur: 20 mg/dL — AB
LEUKOCYTES UA: NEGATIVE
Nitrite: NEGATIVE
PH: 6 (ref 5.0–8.0)
Protein, ur: NEGATIVE mg/dL
Specific Gravity, Urine: 1.029 (ref 1.005–1.030)

## 2017-12-14 LAB — LIPASE, BLOOD: LIPASE: 22 U/L (ref 11–51)

## 2017-12-14 MED ORDER — SODIUM CHLORIDE 0.9 % IV SOLN
Freq: Once | INTRAVENOUS | Status: AC
  Administered 2017-12-14: 14:00:00 via INTRAVENOUS

## 2017-12-14 MED ORDER — IOPAMIDOL (ISOVUE-300) INJECTION 61%
INTRAVENOUS | Status: AC
  Administered 2017-12-14: 17:00:00
  Administered 2017-12-14: 100 mL via INTRAVENOUS
  Filled 2017-12-14: qty 100

## 2017-12-14 MED ORDER — IOPAMIDOL (ISOVUE-300) INJECTION 61%
INTRAVENOUS | Status: AC
  Administered 2017-12-14: 17:00:00
  Administered 2017-12-14: 30 mL via ORAL
  Filled 2017-12-14: qty 30

## 2017-12-14 MED ORDER — PROMETHAZINE HCL 25 MG/ML IJ SOLN
25.0000 mg | Freq: Once | INTRAMUSCULAR | Status: AC
  Administered 2017-12-14: 25 mg via INTRAVENOUS
  Filled 2017-12-14: qty 1

## 2017-12-14 MED ORDER — ONDANSETRON HCL 4 MG/2ML IJ SOLN
4.0000 mg | Freq: Once | INTRAMUSCULAR | Status: AC
  Administered 2017-12-14: 4 mg via INTRAVENOUS
  Filled 2017-12-14: qty 2

## 2017-12-14 MED ORDER — DICYCLOMINE HCL 10 MG PO CAPS
10.0000 mg | ORAL_CAPSULE | Freq: Once | ORAL | Status: AC
  Administered 2017-12-14: 10 mg via ORAL
  Filled 2017-12-14: qty 1

## 2017-12-14 MED ORDER — DICYCLOMINE HCL 20 MG PO TABS: 20.0000 mg | ORAL_TABLET | Freq: Two times a day (BID) | ORAL | 0 refills | Status: DC

## 2017-12-14 MED ORDER — ONDANSETRON HCL 4 MG PO TABS: 4.0000 mg | ORAL_TABLET | Freq: Four times a day (QID) | ORAL | 0 refills | Status: DC

## 2017-12-14 NOTE — ED Triage Notes (Signed)
Pt with n/v/d since yesterday, hyperventilating causing hands to feel numb. Pt noncompliant in slowing breathing

## 2017-12-14 NOTE — ED Provider Notes (Signed)
[facility]-EMERGENCY DEPT Provider Note   CSN: [id number] Arrival date & time: 12/14/17  1243     History   Chief Complaint Chief Complaint  Patient presents with  . Emesis  . Diarrhea    HPI Bruce Shelton is a 32 y.o. male who presents for evaluation of nausea/vomiting and diarrhea and lower abdominal pain that began last night.  Patient reports that lower abdominal pain is constant and is better if he brings his knees up towards his chest.  He describes it as a "sharp/aching pain."  No other alleviating factors.  He has not tried any medication for the symptoms.  He reports multiple episodes of vomiting since onset.  Emesis is nonbloody, nonbilious.  He reports he is not able to tolerate any p.o.  Patient also reports several episodes of diarrhea that began this morning.  No blood in the stools.  Patient does report that he ate ox tails from a restaurant last night.  He states that other people ate at the same restaurant but did not have any symptoms.  Patient has not taken any medications for symptoms.  He denies any fevers, chest pain, difficulty breathing, urinary complaints, testicular pain, penile discharge.   The history is provided by the patient.    Past Medical History:  Diagnosis Date  . Hemorrhoid   . Hypokalemia     Patient Active Problem List   Diagnosis Date Noted  . Dehydration 07/02/2015  . AKI (acute kidney injury) (HCC) 07/02/2015  . Hemorrhoid     Past Surgical History:  Procedure Laterality Date  . HEMORRHOID SURGERY N/A 07/03/2015   Procedure: Exam under anesthesia, HEMORRHOIDECTOMY;  Surgeon: Avel Peace, MD;  Location: [facility];  Service: General;  Laterality: N/A;  . HERNIA REPAIR    . WISDOM TOOTH EXTRACTION         Home Medications    Prior to Admission medications   Medication Sig Start Date End Date Taking? Authorizing Provider  dicyclomine (BENTYL) 20 MG tablet Take 1 tablet (20 mg total) by mouth 2 (two) times  daily. 12/14/17   Maxwell Caul, PA-C  ondansetron (ZOFRAN) 4 MG tablet Take 1 tablet (4 mg total) by mouth every 6 (six) hours. 12/14/17   Maxwell Caul, PA-C    Family History No family history on file.  Social History Social History   Tobacco Use  . Smoking status: Never Smoker  . Smokeless tobacco: Never Used  Substance Use Topics  . Alcohol use: No  . Drug use: Yes    Types: Marijuana     Allergies   Patient has no known allergies.   Review of Systems Review of Systems  Constitutional: Negative for chills and fever.  HENT: Negative for congestion.   Eyes: Negative for visual disturbance.  Respiratory: Negative for cough and shortness of breath.   Cardiovascular: Negative for chest pain.  Gastrointestinal: Positive for abdominal pain, diarrhea, nausea and vomiting.  Genitourinary: Negative for dysuria, hematuria, penile swelling and scrotal swelling.  Musculoskeletal: Negative for back pain and neck pain.  Skin: Negative for rash.  Neurological: Negative for dizziness, weakness, numbness and headaches.  Psychiatric/Behavioral: Negative for confusion.  All other systems reviewed and are negative.    Physical Exam Updated Vital Signs BP 124/85   Pulse 82   Temp 98 F (36.7 C)   Resp 18   Ht 6' (1.829 m)   SpO2 98%   BMI 23.73 kg/m   Physical Exam  Constitutional: He is  oriented to person, place, and time. He appears well-developed and well-nourished.  Appears uncomfortable but no acute distress   HENT:  Head: Normocephalic and atraumatic.  Mouth/Throat: Oropharynx is clear and moist and mucous membranes are normal.  Eyes: Conjunctivae, EOM and lids are normal. Pupils are equal, round, and reactive to light.  Neck: Full passive range of motion without pain.  Cardiovascular: Normal rate, regular rhythm, normal heart sounds and normal pulses. Exam reveals no gallop and no friction rub.  No murmur heard. Pulmonary/Chest: Effort normal and breath sounds  normal.  Abdominal: Soft. Normal appearance. There is tenderness in the right lower quadrant, suprapubic area and left lower quadrant. There is no rigidity, no guarding and no CVA tenderness. A hernia is present. Hernia confirmed positive in the right inguinal area.  Abdomen is soft, nondistended.  There is some voluntary guarding noted.  Patient does have diffuse tenderness to palpation to the lower abdomen.   Genitourinary:  Genitourinary Comments: The exam was performed with a chaperone present. Normal male genitalia. No evidence of rash, ulcers or lesions.  Easily reducible right-sided inguinal hernia.  No surrounding warmth or erythema noted. Does not appear strangulated.   Musculoskeletal: Normal range of motion.  Neurological: He is alert and oriented to person, place, and time.  Skin: Skin is warm and dry. Capillary refill takes less than 2 seconds.  Psychiatric: He has a normal mood and affect. His speech is normal.  Nursing note and vitals reviewed.    ED Treatments / Results  Labs (all labs ordered are listed, but only abnormal results are displayed) Labs Reviewed  COMPREHENSIVE METABOLIC PANEL - Abnormal; Notable for the following components:      Result Value   Glucose, Bld 120 (*)    Total Protein 9.4 (*)    All other components within normal limits  CBC - Abnormal; Notable for the following components:   WBC 12.8 (*)    Hemoglobin 17.5 (*)    All other components within normal limits  URINALYSIS, ROUTINE W REFLEX MICROSCOPIC - Abnormal; Notable for the following components:   Ketones, ur 20 (*)    All other components within normal limits  LIPASE, BLOOD    EKG  EKG Interpretation None       Radiology Ct Abdomen Pelvis W Contrast  Result Date: 12/14/2017 CLINICAL DATA:  Diffuse abdominal pain, nausea, vomiting and diarrhea since yesterday. EXAM: CT ABDOMEN AND PELVIS WITH CONTRAST TECHNIQUE: Multidetector CT imaging of the abdomen and pelvis was performed using  the standard protocol following bolus administration of intravenous contrast. CONTRAST:  100 cc Isovue-300 COMPARISON:  Abdomen pelvis radiograph dated 07/04/2015 and abdomen and pelvis CT dated 07/02/2015. FINDINGS: Lower chest: Stable bulla in the medial aspect of the right lower lobe. Stable posterolateral diaphragmatic hernia on the left containing herniated fat. Hepatobiliary: No focal liver abnormality is seen. No gallstones, gallbladder wall thickening, or biliary dilatation. Pancreas: Unremarkable. No pancreatic ductal dilatation or surrounding inflammatory changes. Spleen: Normal in size without focal abnormality. Adrenals/Urinary Tract: Adrenal glands are unremarkable. Kidneys are normal, without renal calculi, focal lesion, or hydronephrosis. Bladder is unremarkable. Stomach/Bowel: Small hiatal hernia. Appendix appears normal. No evidence of bowel wall thickening, distention, or inflammatory changes. Vascular/Lymphatic: No significant vascular findings are present. No enlarged abdominal or pelvic lymph nodes. Reproductive: Prostate is unremarkable. Other: Small right inguinal hernia containing fat. Musculoskeletal: Unremarkable bones. IMPRESSION: 1. No acute abnormality. 2. Small hiatal hernia. Electronically Signed   By: Beckie SaltsSteven  Reid M.D.   On:  12/14/2017 16:55    Procedures Procedures (including critical care time)  Medications Ordered in ED Medications  ondansetron (ZOFRAN) injection 4 mg (4 mg Intravenous Given 12/14/17 1331)  0.9 %  sodium chloride infusion ( Intravenous Stopped 12/14/17 1424)  dicyclomine (BENTYL) capsule 10 mg (10 mg Oral Given 12/14/17 1423)  iopamidol (ISOVUE-300) 61 % injection (  Contrast Given 12/14/17 1641)  promethazine (PHENERGAN) injection 25 mg (25 mg Intravenous Given 12/14/17 1510)  iopamidol (ISOVUE-300) 61 % injection (  Contrast Given 12/14/17 1641)     Initial Impression / Assessment and Plan / ED Course  I have reviewed the triage vital signs and the nursing  notes.  Pertinent labs & imaging results that were available during my care of the patient were reviewed by me and considered in my medical decision making (see chart for details).     32 year old male who presents for evaluation of nausea/vomiting/diarrhea abdominal pain that began yesterday.  Patient does report that he ate Ox tails at a restaurant.  No fevers, chest pain, difficulty breathing. Patient is afebrile, non-toxic appearing, sitting comfortably on examination table. Vital signs reviewed and stable.  On exam, patient does have an easily reducible right inguinal hernia. No overlying warmth, erythema. Does not appear strangulated. Patient significant lower abdominal exam both to right and left lower quadrants.  Suspect viral GI process. Though given considerable tendernesin the right lower quadrant, will pls an for CT evaluation. Low suspicion for appendicitis given history of diarrhea. Basic labs ordered at triage. IVF given for fluid resuscitation.  Analgesics and antiemetics given.  Labs and imaging reviewed.  Lipase unremarkable.  CBC with slight leukocytosis.  CMP is unremarkable.  UA is negative for any signs of infection. CT abd/pelvis with no acute abnormalities. Discussed results with patient. He reports improvement in symptoms after fluids, antiemetics and analgesics.  We will plan to p.o. challenge patient in the department.  Patient able to tolerate p.o. in the department without any difficulty.  Vital signs are stable.  Repeat abdominal exam shows no rigidity, guarding, peritoneal signs.  Patient reports improved abdominal tenderness.  Suspect a viral GI syndrome.  We will plan to give supportive care for home.  Encourage patient to follow-up with primary care doctor in the next 24-48 hours for further evaluation. Patient had ample opportunity for questions and discussion. All patient's questions were answered with full understanding. Strict return precautions discussed. Patient  expresses understanding and agreement to plan.    Final Clinical Impressions(s) / ED Diagnoses   Final diagnoses:  Nausea vomiting and diarrhea  Lower abdominal pain    ED Discharge Orders        Ordered    ondansetron (ZOFRAN) 4 MG tablet  Every 6 hours     12/14/17 1801    dicyclomine (BENTYL) 20 MG tablet  2 times daily     12/14/17 1801       Rosana Hoes 12/14/17 Alen Bleacher, MD 12/16/17 1112

## 2017-12-14 NOTE — Discharge Instructions (Signed)
Take zofran as directed for nausea. Take bentyl as needed for pain.   Make sure you are drinking plenty of fluids. Eat bland foods.  Follow-up with your primary care doctor in the next 24-48 hours.   Return to the Emergency Department immediately if you experience any worsening abdominal pain, fever, persistent nausea and vomiting, inability keep any food down, pain with urination, blood in your urine or any other worsening or concerning symptoms.

## 2017-12-15 ENCOUNTER — Other Ambulatory Visit: Payer: Self-pay

## 2017-12-15 ENCOUNTER — Encounter (HOSPITAL_COMMUNITY): Payer: Self-pay

## 2017-12-15 ENCOUNTER — Emergency Department (HOSPITAL_COMMUNITY)
Admission: EM | Admit: 2017-12-15 | Discharge: 2017-12-15 | Disposition: A | Discharge status: Home / Self Care | Payer: Managed Care, Other (non HMO) | Attending: Emergency Medicine | Admitting: Emergency Medicine

## 2017-12-15 DIAGNOSIS — R112 Nausea with vomiting, unspecified: Secondary | ICD-10-CM

## 2017-12-15 MED ORDER — METOCLOPRAMIDE HCL 5 MG/ML IJ SOLN
10.0000 mg | Freq: Once | INTRAMUSCULAR | Status: AC
  Administered 2017-12-15: 10 mg via INTRAVENOUS
  Filled 2017-12-15: qty 2

## 2017-12-15 MED ORDER — SODIUM CHLORIDE 0.9 % IV BOLUS (SEPSIS)
1000.0000 mL | Freq: Once | INTRAVENOUS | Status: AC
Start: 2017-12-15 — End: 2017-12-15
  Administered 2017-12-15: 1000 mL via INTRAVENOUS

## 2017-12-15 MED ORDER — ONDANSETRON 4 MG PO TBDP
4.0000 mg | ORAL_TABLET | Freq: Once | ORAL | Status: AC | PRN
  Administered 2017-12-15: 4 mg via ORAL
  Filled 2017-12-15: qty 1

## 2017-12-15 MED ORDER — HALOPERIDOL LACTATE 5 MG/ML IJ SOLN
2.0000 mg | Freq: Once | INTRAMUSCULAR | Status: AC
  Administered 2017-12-15: 2 mg via INTRAVENOUS
  Filled 2017-12-15: qty 1

## 2017-12-15 NOTE — ED Notes (Signed)
Pt had labs drawn less than 24 hours ago.

## 2017-12-15 NOTE — Discharge Instructions (Signed)
It was our pleasure to provide your ER care today - we hope that you feel better.  Rest. Drink plenty of fluids.  Take zofran as need for nausea.  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   Follow up with primary care doctor in 1-2 days if symptoms fail to improve/resolve.  Return to ER if worse, persistent vomiting, fevers, severe abdominal pain, other concern.  You were given sedating medication in the ER - no driving for the next 6 hours.

## 2017-12-15 NOTE — ED Provider Notes (Signed)
Patient with recurrent nv. Hx thc use ?possibly the cause. Recent ct negative. abd soft nt.  Tolerating po. Pt appears stable for d/c.      Bruce LaineSteinl, Bruce Vences, MD 12/15/17 [phone]

## 2017-12-15 NOTE — ED Provider Notes (Signed)
[facility]-EMERGENCY DEPT Provider Note   CSN: [id number] Arrival date & time: 12/14/17  2357     History   Chief Complaint Chief Complaint  Patient presents with  . Emesis  . Abdominal Pain    HPI Bruce Shelton is a 32 y.o. male.  Patient here for evaluation of nausea, vomiting and diarrhea, persistent since being seen yesterday in this ED for the same. No fever. He reports when he went home he was unable to keep the prescribed medications (Bentyl, Zofran) on his stomach. Emesis has started to show a small amount of blood but no frank hematemesis. He continues to have abdominal cramping that is constant but more intense prior to vomiting episode.    The history is provided by the patient. No language interpreter was used.  Emesis   Associated symptoms include abdominal pain and diarrhea. Pertinent negatives include no chills, no fever, no headaches and no myalgias.  Abdominal Pain   Associated symptoms include diarrhea, nausea and vomiting. Pertinent negatives include fever, headaches and myalgias.    Past Medical History:  Diagnosis Date  . Hemorrhoid   . Hypokalemia     Patient Active Problem List   Diagnosis Date Noted  . Dehydration 07/02/2015  . AKI (acute kidney injury) (HCC) 07/02/2015  . Hemorrhoid     Past Surgical History:  Procedure Laterality Date  . HEMORRHOID SURGERY N/A 07/03/2015   Procedure: Exam under anesthesia, HEMORRHOIDECTOMY;  Surgeon: Avel Peace, MD;  Location: [facility];  Service: General;  Laterality: N/A;  . HERNIA REPAIR    . WISDOM TOOTH EXTRACTION         Home Medications    Prior to Admission medications   Medication Sig Start Date End Date Taking? Authorizing Provider  dicyclomine (BENTYL) 20 MG tablet Take 1 tablet (20 mg total) by mouth 2 (two) times daily. 12/14/17  Yes Graciella Freer A, PA-C  ondansetron (ZOFRAN) 4 MG tablet Take 1 tablet (4 mg total) by mouth every 6 (six) hours. Patient taking  differently: Take 4 mg by mouth every 6 (six) hours as needed for nausea or vomiting.  12/14/17  Yes Maxwell Caul PA-C    Family History History reviewed. No pertinent family history.  Social History Social History   Tobacco Use  . Smoking status: Never Smoker  . Smokeless tobacco: Never Used  Substance Use Topics  . Alcohol use: No  . Drug use: Yes    Types: Marijuana     Allergies   Patient has no known allergies.   Review of Systems Review of Systems  Constitutional: Negative for chills, diaphoresis and fever.  HENT: Negative.   Respiratory: Negative.   Cardiovascular: Negative.   Gastrointestinal: Positive for abdominal pain, diarrhea, nausea and vomiting.  Musculoskeletal: Negative.  Negative for myalgias and neck stiffness.  Skin: Negative.  Negative for rash.  Neurological: Negative.  Negative for syncope and headaches.     Physical Exam Updated Vital Signs BP 138/87   Pulse 78   Temp 98 F (36.7 C) (Oral)   Resp 20   Ht 6' (1.829 m)   Wt 79.4 kg (175 lb)   SpO2 100%   BMI 23.73 kg/m   Physical Exam  Constitutional: He appears well-developed and well-nourished.  HENT:  Head: Normocephalic.  Neck: Normal range of motion. Neck supple.  Cardiovascular: Normal rate and regular rhythm.  Pulmonary/Chest: Effort normal and breath sounds normal.  Abdominal: Soft. Bowel sounds are normal. There is no tenderness. There is  no rebound and no guarding.  Musculoskeletal: Normal range of motion.  Neurological: He is alert. No cranial nerve deficit.  Skin: Skin is warm and dry. No rash noted.  Psychiatric: He has a normal mood and affect.     ED Treatments / Results  Labs (all labs ordered are listed, but only abnormal results are displayed) Labs Reviewed - No data to display  EKG  EKG Interpretation None       Radiology Ct Abdomen Pelvis W Contrast  Result Date: 12/14/2017 CLINICAL DATA:  Diffuse abdominal pain, nausea, vomiting and diarrhea  since yesterday. EXAM: CT ABDOMEN AND PELVIS WITH CONTRAST TECHNIQUE: Multidetector CT imaging of the abdomen and pelvis was performed using the standard protocol following bolus administration of intravenous contrast. CONTRAST:  100 cc Isovue-300 COMPARISON:  Abdomen pelvis radiograph dated 07/04/2015 and abdomen and pelvis CT dated 07/02/2015. FINDINGS: Lower chest: Stable bulla in the medial aspect of the right lower lobe. Stable posterolateral diaphragmatic hernia on the left containing herniated fat. Hepatobiliary: No focal liver abnormality is seen. No gallstones, gallbladder wall thickening, or biliary dilatation. Pancreas: Unremarkable. No pancreatic ductal dilatation or surrounding inflammatory changes. Spleen: Normal in size without focal abnormality. Adrenals/Urinary Tract: Adrenal glands are unremarkable. Kidneys are normal, without renal calculi, focal lesion, or hydronephrosis. Bladder is unremarkable. Stomach/Bowel: Small hiatal hernia. Appendix appears normal. No evidence of bowel wall thickening, distention, or inflammatory changes. Vascular/Lymphatic: No significant vascular findings are present. No enlarged abdominal or pelvic lymph nodes. Reproductive: Prostate is unremarkable. Other: Small right inguinal hernia containing fat. Musculoskeletal: Unremarkable bones. IMPRESSION: 1. No acute abnormality. 2. Small hiatal hernia. Electronically Signed   By: Beckie SaltsSteven  Reid M.D.   On: 12/14/2017 16:55    Procedures Procedures (including critical care time)  Medications Ordered in ED Medications  sodium chloride 0.9 % bolus 1,000 mL (not administered)  metoCLOPramide (REGLAN) injection 10 mg (not administered)  ondansetron (ZOFRAN-ODT) disintegrating tablet 4 mg (4 mg Oral Given 12/15/17 0041)     Initial Impression / Assessment and Plan / ED Course  I have reviewed the triage vital signs and the nursing notes.  Pertinent labs & imaging results that were available during my care of the  patient were reviewed by me and considered in my medical decision making (see chart for details).     Patient returns to the ED with persistent vomiting and diarrhea. Seen yesterday. He reports he cannot keep the medications provided on his stomach.   VSS. No tachycardia. IV Reglan provided. Patient given a PO challenge and has further vomiting. Abdominal exam is benign. CT scan done yesterday and was found to be negative.   IV Haldol provided. Will need to be re-assessed by PO challenge. Anticipate discharge home. Patient care signed out to Cathren LaineKevin Steinl, MD, for re-evaluation.   Final Clinical Impressions(s) / ED Diagnoses   Final diagnoses:  None   1. Vomiting  ED Discharge Orders    None       Elpidio AnisUpstill, Delvin Hedeen, PA-C 12/15/17 [id number]    Geoffery Lyonselo, Douglas, MD 12/15/17 [phone]

## 2017-12-15 NOTE — ED Triage Notes (Addendum)
Seen here for same yesterday and now back because he is not feeling any better now.

## 2017-12-16 ENCOUNTER — Encounter (HOSPITAL_COMMUNITY): Payer: Self-pay | Admitting: Emergency Medicine

## 2017-12-16 ENCOUNTER — Observation Stay (HOSPITAL_COMMUNITY)
Admission: EM | Admit: 2017-12-16 | Discharge: 2017-12-17 | Disposition: A | Discharge status: Home / Self Care | Payer: Managed Care, Other (non HMO) | Attending: Internal Medicine | Admitting: Internal Medicine

## 2017-12-16 DIAGNOSIS — R112 Nausea with vomiting, unspecified: Principal | ICD-10-CM

## 2017-12-16 DIAGNOSIS — R1110 Vomiting, unspecified: Secondary | ICD-10-CM | POA: Diagnosis present

## 2017-12-16 DIAGNOSIS — G43A1 Cyclical vomiting, intractable: Secondary | ICD-10-CM | POA: Diagnosis not present

## 2017-12-16 DIAGNOSIS — F1210 Cannabis abuse, uncomplicated: Secondary | ICD-10-CM | POA: Diagnosis not present

## 2017-12-16 DIAGNOSIS — E876 Hypokalemia: Secondary | ICD-10-CM

## 2017-12-16 DIAGNOSIS — R197 Diarrhea, unspecified: Secondary | ICD-10-CM | POA: Diagnosis not present

## 2017-12-16 LAB — COMPREHENSIVE METABOLIC PANEL
ALBUMIN: 4.3 g/dL (ref 3.5–5.0)
ALT: 30 U/L (ref 17–63)
ANION GAP: 14 (ref 5–15)
AST: 38 U/L (ref 15–41)
Alkaline Phosphatase: 57 U/L (ref 38–126)
BILIRUBIN TOTAL: 1.1 mg/dL (ref 0.3–1.2)
BUN: 16 mg/dL (ref 6–20)
CO2: 19 mmol/L — ABNORMAL LOW (ref 22–32)
Calcium: 9.1 mg/dL (ref 8.9–10.3)
Chloride: 105 mmol/L (ref 101–111)
Creatinine, Ser: 1 mg/dL (ref 0.61–1.24)
GFR calc non Af Amer: >60 mL/min (ref >60)
Glucose, Bld: 105 mg/dL — ABNORMAL HIGH (ref 65–99)
POTASSIUM: 2.9 mmol/L — AB (ref 3.5–5.1)
Sodium: 138 mmol/L (ref 135–145)
TOTAL PROTEIN: 7.6 g/dL (ref 6.5–8.1)

## 2017-12-16 LAB — CBC
HEMATOCRIT: 42.1 % (ref 39.0–52.0)
HEMOGLOBIN: 14.8 g/dL (ref 13.0–17.0)
MCH: 30.9 pg (ref 26.0–34.0)
MCHC: 35.2 g/dL (ref 30.0–36.0)
MCV: 87.9 fL (ref 78.0–100.0)
Platelets: 181 10*3/uL (ref 150–400)
RBC: 4.79 MIL/uL (ref 4.22–5.81)
RDW: 12.7 % (ref 11.5–15.5)
WBC: 8 10*3/uL (ref 4.0–10.5)

## 2017-12-16 LAB — LIPASE, BLOOD: Lipase: 16 U/L (ref 11–51)

## 2017-12-16 LAB — MAGNESIUM: MAGNESIUM: 2 mg/dL (ref 1.7–2.4)

## 2017-12-16 MED ORDER — ONDANSETRON 4 MG PO TBDP
4.0000 mg | ORAL_TABLET | Freq: Once | ORAL | Status: AC | PRN
  Administered 2017-12-16: 4 mg via ORAL
  Filled 2017-12-16 (×2): qty 1

## 2017-12-16 MED ORDER — POTASSIUM CHLORIDE CRYS ER 20 MEQ PO TBCR
40.0000 meq | EXTENDED_RELEASE_TABLET | Freq: Once | ORAL | Status: AC
  Administered 2017-12-17: 40 meq via ORAL
  Filled 2017-12-16: qty 2

## 2017-12-16 MED ORDER — ONDANSETRON 4 MG PO TBDP
4.0000 mg | ORAL_TABLET | Freq: Once | ORAL | Status: AC | PRN
  Administered 2017-12-16: 4 mg via ORAL
  Filled 2017-12-16: qty 1

## 2017-12-16 MED ORDER — SODIUM CHLORIDE 0.9 % IV BOLUS (SEPSIS)
1000.0000 mL | Freq: Once | INTRAVENOUS | Status: AC
Start: 2017-12-16 — End: 2017-12-16
  Administered 2017-12-16: 1000 mL via INTRAVENOUS

## 2017-12-16 MED ORDER — HALOPERIDOL LACTATE 5 MG/ML IJ SOLN
5.0000 mg | Freq: Once | INTRAMUSCULAR | Status: AC
  Administered 2017-12-16: 5 mg via INTRAVENOUS
  Filled 2017-12-16: qty 1

## 2017-12-16 MED ORDER — PANTOPRAZOLE SODIUM 40 MG IV SOLR
40.0000 mg | Freq: Two times a day (BID) | INTRAVENOUS | Status: DC
  Administered 2017-12-17 (×2): 40 mg via INTRAVENOUS
  Filled 2017-12-16 (×2): qty 40

## 2017-12-16 MED ORDER — CAPSAICIN 0.025 % EX CREA
TOPICAL_CREAM | Freq: Once | CUTANEOUS | Status: AC
  Administered 2017-12-16: 22:00:00 via TOPICAL
  Filled 2017-12-16: qty 60

## 2017-12-16 MED ORDER — POTASSIUM CHLORIDE 10 MEQ/100ML IV SOLN
10.0000 meq | INTRAVENOUS | Status: AC
  Administered 2017-12-16 (×2): 10 meq via INTRAVENOUS
  Filled 2017-12-16 (×2): qty 100

## 2017-12-16 NOTE — ED Triage Notes (Signed)
Patient presents with mother stating patient has been vomiting and diarrhea since Saturday. States he has lost weight due to not eating. Right sided abdominal pain. States when to UC and could not obtain IV access and was sent here. Pt states had labs and CT scan with no results. States prescriptions sent home with are not helping.

## 2017-12-16 NOTE — H&P (Signed)
History and Physical    Bruce KitchensVictor Nulty ZOX:[id number] DOB: 07/05/1986 DOA: 12/16/2017  PCP: Leilani Ableeese, Betti, MD   Patient coming from: Home  Chief Complaint: Persistent vomiting  HPI: Bruce Shelton is a 32 y.o. male with medical history significant for Cannabis use, presented to the ED with complaints of vomiting of 4 days duration.  Patient reports multiple , > 5 episodes of mostly nonbloody emesis daily, but that yesterday he saw a bit of blood, and started vomiting dark coffee grounds.  Also endorses loose stools, about twice daily, yesterday and today, nonbloody nonmucoid yellow colored.  Associated generalized abdominal pain. No Dysuria, No  frequency.  Patient lives with girlfriend and kids, no one with similar symptoms.  Denies NSAID use, alcohol intake or history of peptic ulcer disease.  Patient endorses cannabis use, reports last use 4 days ago.  This is patient's third visit to Ed for these complaints in the past 48 hrs.  Several ED visits in the past for emesis, could be due to cannabis use.  Had CT abdomen and pelvis with contrast 12/14/17-unremarkable, small hiatal hernia.  ED Course: Stable vitals. K low 2.9, mag normal 2.0, Cr baseline. Hgb earlier today- 14.8.  Hospitalist was called to admit, considering this is patient's third visit in 2 days, for intractable vomiting likely from cannabis use.   Review of Systems: As per HPI otherwise 10 point review of systems negative.  Past Medical History:  Diagnosis Date  . Hemorrhoid   . Hypokalemia     Past Surgical History:  Procedure Laterality Date  . HEMORRHOID SURGERY N/A 07/03/2015   Procedure: Exam under anesthesia, HEMORRHOIDECTOMY;  Surgeon: Avel Peaceodd Rosenbower, MD;  Location: [facility];  Service: General;  Laterality: N/A;  . HERNIA REPAIR    . WISDOM TOOTH EXTRACTION       reports that  has never smoked. he has never used smokeless tobacco. He reports that he uses drugs. Drug: Marijuana. He reports that he does not drink  alcohol.  No Known Allergies  Emily history not contributory  Prior to Admission medications   Medication Sig Start Date End Date Taking? Authorizing Provider  bismuth subsalicylate (PEPTO BISMOL) 262 MG/15ML suspension Take 30 mLs by mouth once.   Yes [provider]  dicyclomine (BENTYL) 20 MG tablet Take 1 tablet (20 mg total) by mouth 2 (two) times daily. 12/14/17  Yes Graciella FreerLayden, Lindsey A, PA-C  ondansetron (ZOFRAN) 4 MG tablet Take 1 tablet (4 mg total) by mouth every 6 (six) hours. Patient taking differently: Take 4 mg by mouth every 6 (six) hours as needed for nausea or vomiting.  12/14/17  Yes Maxwell CaulLayden, Lindsey A, PA-C  ranitidine (ZANTAC) 75 MG tablet Take 75 mg by mouth once.   Yes [provider]    Physical Exam: Vitals:   12/16/17 1148 12/16/17 1527 12/16/17 1943  BP: (!) 132/97 (!) 139/95 (!) 144/104  Pulse: 69 64 61  Resp: 16 14 18   Temp: 98.2 F (36.8 C) 98.1 F (36.7 C)   TempSrc: Oral Oral   SpO2: 100% 100% 100%    Constitutional: NAD, calm, comfortable Vitals:   12/16/17 1148 12/16/17 1527 12/16/17 1943  BP: (!) 132/97 (!) 139/95 (!) 144/104  Pulse: 69 64 61  Resp: 16 14 18   Temp: 98.2 F (36.8 C) 98.1 F (36.7 C)   TempSrc: Oral Oral   SpO2: 100% 100% 100%   Eyes: PERRL, lids and conjunctivae normal ENMT: Mucous membranes are moist. Posterior pharynx clear of any exudate  or lesions. Neck: normal, supple, no masses, no thyromegaly Respiratory: clear to auscultation bilaterally, no wheezing, no crackles. Normal respiratory effort. No accessory muscle use.  Cardiovascular: Regular rate and rhythm, no murmurs / rubs / gallops. No extremity edema. 2+ pedal pulses. Abdomen: Diffusely tender abdomen , no masses palpated. No hepatosplenomegaly. Bowel sounds positive.  Musculoskeletal: no clubbing / cyanosis. No joint deformity upper and lower extremities. Good ROM, no contractures. Normal muscle tone.  Skin: no rashes, lesions, ulcers. No  induration Neurologic: CN 2-12 grossly intact.  Strength 5/5 in all 4.  Psychiatric: Normal judgment and insight. Alert and oriented x 3. Normal mood.   Labs on Admission: I have personally reviewed following labs and imaging studies  CBC: Recent Labs  Lab 12/14/17 1315 12/16/17 1200  WBC 12.8* 8.0  HGB 17.5* 14.8  HCT 49.6 42.1  MCV 88.3 87.9  PLT 205 181   Basic Metabolic Panel: Recent Labs  Lab 12/14/17 1315 12/16/17 1200 12/16/17 2117  NA 138 138  --   K 3.9 2.9*  --   CL 104 105  --   CO2 23 19*  --   GLUCOSE 120* 105*  --   BUN 18 16  --   CREATININE 0.94 1.00  --   CALCIUM 10.3 9.1  --   MG  --   --  2.0   GFR: Estimated Creatinine Clearance: 119.6 mL/min (by C-G formula based on SCr of 1 mg/dL). Liver Function Tests: Recent Labs  Lab 12/14/17 1315 12/16/17 1200  AST 35 38  ALT 34 30  ALKPHOS 71 57  BILITOT 0.9 1.1  PROT 9.4* 7.6  ALBUMIN 5.0 4.3   Recent Labs  Lab 12/14/17 1315 12/16/17 1200  LIPASE 22 16   Urine analysis:    Component Value Date/Time   COLORURINE YELLOW 12/14/2017 1258   APPEARANCEUR CLEAR 12/14/2017 1258   LABSPEC 1.029 12/14/2017 1258   PHURINE 6.0 12/14/2017 1258   GLUCOSEU NEGATIVE 12/14/2017 1258   HGBUR NEGATIVE 12/14/2017 1258   BILIRUBINUR NEGATIVE 12/14/2017 1258   KETONESUR 20 (A) 12/14/2017 1258   PROTEINUR NEGATIVE 12/14/2017 1258   UROBILINOGEN 0.2 08/31/2015 0114   NITRITE NEGATIVE 12/14/2017 1258   LEUKOCYTESUR NEGATIVE 12/14/2017 1258    Radiological Exams on Admission: No results found.  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 With electrolyte abnormalities and reports of coffee-grounds. 1L bolus given in ED. Lipase normal. -Zofran -Hydrate -Bowel rest with clear liquid diet -N.p.o. Midnight -Encouraged cessation  Cannabis use - Stool C.diff  Hypokalemia- k-  2.9. Mag- 2.  - replete - BMP a.m  Hgb Drop- 14.8 earlier today, 17.5 yesterday,? Hemoconcentration. Baseline 15-16. Reports of coffee ground emesis, abdominal pain yesterday.  Possibly esophageal trauma from persistent vomiting. - CBC repeat, now and a.m - Protonix 40 twice daily -Gastroccult  DVT prophylaxis:  SCds  Code Status: Full  Family Communication: None at bedside  Disposition Plan: 1 -2 days Consults called:  None for now  Admission status:  Obs, with cardiac monitoring for hypokalemia.   Onnie Boer MD [facility] Pager [phone]  If 6PM-2AM, please contact night-coverage [email or web address] Password [email or web address]  12/16/2017, 11:40 PM

## 2017-12-16 NOTE — ED Provider Notes (Signed)
Bruce Shelton   CSN: [id number] Arrival date & time: 12/16/17  1138     History   Chief Complaint Chief Complaint  Patient presents with  . Emesis   HPI   Blood pressure (!) 144/104, pulse 61, temperature 98.1 F (36.7 C), temperature source Oral, resp. rate 18, SpO2 100 %.  Bruce Shelton is a 32 y.o. male complaining of nonbloody, nonbilious, non-coffee-ground emesis with associated diarrhea onset 2 days ago, he has diffuse colicky abdominal pain only before vomiting.  He denies any fevers, chills, sick contacts.  She smokes marijuana regularly.  He states that he has had episodes with bouts of vomiting in the past but never this severe.  Past Medical History:  Diagnosis Date  . Hemorrhoid   . Hypokalemia     Patient Active Problem List   Diagnosis Date Noted  . Dehydration 07/02/2015  . AKI (acute kidney injury) (HCC) 07/02/2015  . Hemorrhoid     Past Surgical History:  Procedure Laterality Date  . HEMORRHOID SURGERY N/A 07/03/2015   Procedure: Exam under anesthesia, HEMORRHOIDECTOMY;  Surgeon: Avel Peace, MD;  Location: [facility];  Service: General;  Laterality: N/A;  . HERNIA REPAIR    . WISDOM TOOTH EXTRACTION         Home Medications    Prior to Admission medications   Medication Sig Start Date End Date Taking? Authorizing Provider  bismuth subsalicylate (PEPTO BISMOL) 262 MG/15ML suspension Take 30 mLs by mouth once.   Yes [provider]  dicyclomine (BENTYL) 20 MG tablet Take 1 tablet (20 mg total) by mouth 2 (two) times daily. 12/14/17  Yes Graciella Freer A, PA-C  ondansetron (ZOFRAN) 4 MG tablet Take 1 tablet (4 mg total) by mouth every 6 (six) hours. Patient taking differently: Take 4 mg by mouth every 6 (six) hours as needed for nausea or vomiting.  12/14/17  Yes Maxwell Caul, PA-C  ranitidine (ZANTAC) 75 MG tablet Take 75 mg by mouth once.   Yes [provider]    Family  History No family history on file.  Social History Social History   Tobacco Use  . Smoking status: Never Smoker  . Smokeless tobacco: Never Used  Substance Use Topics  . Alcohol use: No  . Drug use: Yes    Types: Marijuana     Allergies   Patient has no known allergies.   Review of Systems Review of Systems  A complete review of systems was obtained and all systems are negative except as noted in the HPI and PMH.    Physical Exam Updated Vital Signs BP (!) 144/104 (BP Location: Right Arm)   Pulse 61   Temp 98.1 F (36.7 C) (Oral)   Resp 18   SpO2 100%   Physical Exam  Constitutional: He is oriented to person, place, and time. He appears well-developed and well-nourished. No distress.  HENT:  Head: Normocephalic and atraumatic.  Mouth/Throat: Oropharynx is clear and moist.  Eyes: Conjunctivae and EOM are normal. Pupils are equal, round, and reactive to light.  Neck: Normal range of motion.  Cardiovascular: Normal rate, regular rhythm and intact distal pulses.  Pulmonary/Chest: Effort normal and breath sounds normal.  Abdominal: Soft. Bowel sounds are normal. He exhibits no distension and no mass. There is no tenderness. There is no rebound and no guarding. A hernia is present.  Large, right-sided inguinal hernia nontender and easily reducible.  Musculoskeletal: Normal range of motion.  Neurological: He is alert  and oriented to person, place, and time.  Skin: He is not diaphoretic.  Psychiatric: He has a normal mood and affect.  Nursing Shelton and vitals reviewed.    ED Treatments / Results  Labs (all labs ordered are listed, but only abnormal results are displayed) Labs Reviewed  COMPREHENSIVE METABOLIC PANEL - Abnormal; Notable for the following components:      Result Value   Potassium 2.9 (*)    CO2 19 (*)    Glucose, Bld 105 (*)    All other components within normal limits  LIPASE, BLOOD  CBC  MAGNESIUM  URINALYSIS, ROUTINE W REFLEX MICROSCOPIC   RAPID URINE DRUG SCREEN, HOSP PERFORMED    EKG  EKG Interpretation None       Radiology No results found.  Procedures Procedures (including critical care time)  Medications Ordered in ED Medications  ondansetron (ZOFRAN-ODT) disintegrating tablet 4 mg (not administered)  potassium chloride 10 mEq in 100 mL IVPB (10 mEq Intravenous New Bag/Given 12/16/17 2238)  ondansetron (ZOFRAN-ODT) disintegrating tablet 4 mg (4 mg Oral Given 12/16/17 1155)  ondansetron (ZOFRAN-ODT) disintegrating tablet 4 mg (4 mg Oral Given 12/16/17 1533)  haloperidol lactate (HALDOL) injection 5 mg (5 mg Intravenous Given 12/16/17 2136)  capsaicin (ZOSTRIX) 0.025 % cream ( Topical Given 12/16/17 2142)  sodium chloride 0.9 % bolus 1,000 mL (0 mLs Intravenous Stopped 12/16/17 2224)     Initial Impression / Assessment and Plan / ED Course  I have reviewed the triage vital signs and the nursing notes.  Pertinent labs & imaging results that were available during my care of the patient were reviewed by me and considered in my medical decision making (see chart for details).    Vitals:   12/16/17 1148 12/16/17 1527 12/16/17 1943  BP: (!) 132/97 (!) 139/95 (!) 144/104  Pulse: 69 64 61  Resp: 16 14 18   Temp: 98.2 F (36.8 C) 98.1 F (36.7 C)   TempSrc: Oral Oral   SpO2: 100% 100% 100%    Medications  ondansetron (ZOFRAN-ODT) disintegrating tablet 4 mg (not administered)  potassium chloride 10 mEq in 100 mL IVPB (10 mEq Intravenous New Bag/Given 12/16/17 2238)  ondansetron (ZOFRAN-ODT) disintegrating tablet 4 mg (4 mg Oral Given 12/16/17 1155)  ondansetron (ZOFRAN-ODT) disintegrating tablet 4 mg (4 mg Oral Given 12/16/17 1533)  haloperidol lactate (HALDOL) injection 5 mg (5 mg Intravenous Given 12/16/17 2136)  capsaicin (ZOSTRIX) 0.025 % cream ( Topical Given 12/16/17 2142)  sodium chloride 0.9 % bolus 1,000 mL (0 mLs Intravenous Stopped 12/16/17 2224)    Bruce Shelton is 32 y.o. male presenting with audible episodes of  emesis and diarrhea over the course of the last several days.  I suspect that this is likely secondary to cannabinoid hyperemesis syndrome.  Abdominal exam is benign, he had a negative CT several days ago.  Easily reducible inguinal hernia with no clinical signs of obstruction or incarceration.  Patient was discharged home with prescriptions for Zofran, he did not find these helpful.  I have offered him prescriptions for Phenergan however he declines as he states that he does not want to leave because he feels that he will just be back in a few hours.   Final Clinical Impressions(s) / ED Diagnoses   Final diagnoses:  Hypokalemia  Nausea vomiting and diarrhea  Cannabis abuse    ED Discharge Orders    None       Edvin Albus, Mardella Laymanicole, PA-C 12/16/17 2249    Doug SouJacubowitz, Sam, MD 12/17/17 0005

## 2017-12-17 ENCOUNTER — Other Ambulatory Visit: Payer: Self-pay

## 2017-12-17 ENCOUNTER — Encounter (HOSPITAL_COMMUNITY): Payer: Self-pay

## 2017-12-17 DIAGNOSIS — E876 Hypokalemia: Secondary | ICD-10-CM | POA: Diagnosis not present

## 2017-12-17 DIAGNOSIS — R112 Nausea with vomiting, unspecified: Secondary | ICD-10-CM | POA: Diagnosis not present

## 2017-12-17 DIAGNOSIS — F1210 Cannabis abuse, uncomplicated: Secondary | ICD-10-CM

## 2017-12-17 DIAGNOSIS — R197 Diarrhea, unspecified: Secondary | ICD-10-CM

## 2017-12-17 LAB — CBC
HCT: 38 % — ABNORMAL LOW (ref 39.0–52.0)
HEMATOCRIT: 37.6 % — AB (ref 39.0–52.0)
HEMOGLOBIN: 13.1 g/dL (ref 13.0–17.0)
Hemoglobin: 13.3 g/dL (ref 13.0–17.0)
MCH: 30.3 pg (ref 26.0–34.0)
MCH: 30.5 pg (ref 26.0–34.0)
MCHC: 34.8 g/dL (ref 30.0–36.0)
MCHC: 35 g/dL (ref 30.0–36.0)
MCV: 86.6 fL (ref 78.0–100.0)
MCV: 87.6 fL (ref 78.0–100.0)
Platelets: 153 10*3/uL (ref 150–400)
Platelets: 160 10*3/uL (ref 150–400)
RBC: 4.29 MIL/uL (ref 4.22–5.81)
RBC: 4.39 MIL/uL (ref 4.22–5.81)
RDW: 12.9 % (ref 11.5–15.5)
RDW: 12.9 % (ref 11.5–15.5)
WBC: 10.1 10*3/uL (ref 4.0–10.5)
WBC: 9.9 10*3/uL (ref 4.0–10.5)

## 2017-12-17 LAB — URINALYSIS, ROUTINE W REFLEX MICROSCOPIC
BACTERIA UA: NONE SEEN
BILIRUBIN URINE: NEGATIVE
Glucose, UA: NEGATIVE mg/dL
KETONES UR: 80 mg/dL — AB
LEUKOCYTES UA: NEGATIVE
Nitrite: NEGATIVE
PROTEIN: NEGATIVE mg/dL
Specific Gravity, Urine: 1.032 — ABNORMAL HIGH (ref 1.005–1.030)
pH: 6 (ref 5.0–8.0)

## 2017-12-17 LAB — RAPID URINE DRUG SCREEN, HOSP PERFORMED
Amphetamines: NOT DETECTED
BARBITURATES: NOT DETECTED
Benzodiazepines: NOT DETECTED
COCAINE: NOT DETECTED
Opiates: NOT DETECTED
TETRAHYDROCANNABINOL: POSITIVE — AB

## 2017-12-17 LAB — BASIC METABOLIC PANEL
ANION GAP: 7 (ref 5–15)
BUN: 17 mg/dL (ref 6–20)
CHLORIDE: 104 mmol/L (ref 101–111)
CO2: 26 mmol/L (ref 22–32)
Calcium: 8.5 mg/dL — ABNORMAL LOW (ref 8.9–10.3)
Creatinine, Ser: 0.92 mg/dL (ref 0.61–1.24)
GFR calc Af Amer: >60 mL/min (ref >60)
GFR calc non Af Amer: >60 mL/min (ref >60)
GLUCOSE: 128 mg/dL — AB (ref 65–99)
POTASSIUM: 3 mmol/L — AB (ref 3.5–5.1)
Sodium: 137 mmol/L (ref 135–145)

## 2017-12-17 LAB — HIV ANTIBODY (ROUTINE TESTING W REFLEX): HIV Screen 4th Generation wRfx: NONREACTIVE

## 2017-12-17 LAB — C DIFFICILE QUICK SCREEN W PCR REFLEX
C DIFFICILE (CDIFF) TOXIN: NEGATIVE
C DIFFICLE (CDIFF) ANTIGEN: NEGATIVE
C Diff interpretation: NOT DETECTED

## 2017-12-17 MED ORDER — ONDANSETRON HCL 4 MG/2ML IJ SOLN: 4.0000 mg | Freq: Four times a day (QID) | INTRAMUSCULAR | Status: DC | PRN

## 2017-12-17 MED ORDER — POTASSIUM CHLORIDE IN NACL 40-0.9 MEQ/L-% IV SOLN
INTRAVENOUS | Status: DC
  Administered 2017-12-17: 100 mL/h via INTRAVENOUS
  Filled 2017-12-17 (×2): qty 1000

## 2017-12-17 MED ORDER — ONDANSETRON 4 MG PO TBDP: 4.0000 mg | ORAL_TABLET | Freq: Three times a day (TID) | ORAL | 0 refills | Status: DC | PRN

## 2017-12-17 MED ORDER — ONDANSETRON HCL 4 MG PO TABS: 4.0000 mg | ORAL_TABLET | Freq: Four times a day (QID) | ORAL | Status: DC | PRN

## 2017-12-17 MED ORDER — POTASSIUM CHLORIDE CRYS ER 20 MEQ PO TBCR: 40.0000 meq | EXTENDED_RELEASE_TABLET | Freq: Once | ORAL | Status: DC

## 2017-12-17 MED ORDER — PANTOPRAZOLE SODIUM 40 MG IV SOLR: 40.0000 mg | Freq: Every day | INTRAVENOUS | 0 refills | Status: DC

## 2017-12-17 MED ORDER — PANTOPRAZOLE SODIUM 40 MG PO TBEC: 40.0000 mg | DELAYED_RELEASE_TABLET | Freq: Every day | ORAL | 1 refills | Status: DC

## 2017-12-17 MED ORDER — PANTOPRAZOLE SODIUM 40 MG PO TBEC: 40.0000 mg | DELAYED_RELEASE_TABLET | Freq: Every day | ORAL | 0 refills | Status: DC

## 2017-12-17 NOTE — Progress Notes (Signed)
Nsg Discharge Note  Admit Date:  12/16/2017 Discharge date: 12/17/2017   Karen KitchensVictor Dubow to be D/C'd home per MD order.  Patient signed, time and dated last sheet of AVS for chart. Patient able to verbalize understanding.  Discharge Medication: Allergies as of 12/17/2017   No Known Allergies     Medication List    STOP taking these medications   ondansetron 4 MG tablet Commonly known as:  ZOFRAN     TAKE these medications   bismuth subsalicylate 262 MG/15ML suspension Commonly known as:  PEPTO BISMOL Take 30 mLs by mouth once.   dicyclomine 20 MG tablet Commonly known as:  BENTYL Take 1 tablet (20 mg total) by mouth 2 (two) times daily.   ondansetron 4 MG disintegrating tablet Commonly known as:  ZOFRAN ODT Take 1 tablet (4 mg total) by mouth every 8 (eight) hours as needed for nausea or vomiting.   pantoprazole 40 MG tablet Commonly known as:  PROTONIX Take 1 tablet (40 mg total) by mouth daily.   ranitidine 75 MG tablet Commonly known as:  ZANTAC Take 75 mg by mouth once.       Discharge Assessment: Vitals:   12/17/17 0104 12/17/17 0550  BP: 129/85 123/88  Pulse: (!) 48 67  Resp: 16 16  Temp: 99 F (37.2 C) 99.1 F (37.3 C)  SpO2: 96% 100%   Skin clean, dry and intact without evidence of skin break down, no evidence of skin tears noted. IV catheter discontinued intact. Site without signs and symptoms of complications - no redness or edema noted at insertion site, patient denies c/o pain - only slight tenderness at site.  Dressing with slight pressure applied.  D/c Instructions-Education: Discharge instructions given to patient with verbalized understanding. D/c education completed with patient including follow up instructions, medication list, d/c activities limitations if indicated, with other d/c instructions as indicated by MD - patient able to verbalize understanding, all questions fully answered. Patient instructed to return to ED, call 911, or call MD for  any changes in condition.  Patient ambulated off unit with significant other.   RN had paged MD for work note for pt. Pt stated he could not wait any longer for work note and left unit with his significant other.  Tobin Chadracy Durand Wittmeyer, RN 12/17/2017 12:02 PM

## 2017-12-17 NOTE — Discharge Summary (Signed)
Physician Discharge Summary  Bruce Shelton ZOX:[id number] DOB: 1986-07-31 DOA: 12/16/2017  PCP: Leilani Able, MD  Admit date: 12/16/2017 Discharge date: 12/17/2017  Admitted From: home Disposition:  home  Recommendations for Outpatient Follow-up:  1. Follow up with PCP in 1-2 week  Home Health: none Equipment/Devices: none  Discharge Condition: stable CODE STATUS: Full code Diet recommendation: regular  HPI: Per Dr. Mariea Clonts, Bruce Shelton is a 32 y.o. male with medical history significant for Cannabis use, presented to the ED with complaints of vomiting of 4 days duration.  Patient reports multiple , > 5 episodes of mostly nonbloody emesis daily, but that yesterday he saw a bit of blood, and started vomiting dark coffee grounds.  Also endorses loose stools, about twice daily, yesterday and today, nonbloody nonmucoid yellow colored.  Associated generalized abdominal pain. No Dysuria, No  frequency. Patient lives with girlfriend and kids, no one with similar symptoms.  Denies NSAID use, alcohol intake or history of peptic ulcer disease.  Patient endorses cannabis use, reports last use 4 days ago.  This is patient's third visit to Ed for these complaints in the past 48 hrs.  Several ED visits in the past for emesis, could be due to cannabis use.  Had CT abdomen and pelvis with contrast 12/14/17-unremarkable, small hiatal hernia. ED Course: Stable vitals. K low 2.9, mag normal 2.0, Cr baseline. Hgb earlier today- 14.8.  Hospitalist was called to admit, considering this is patient's third visit in 2 days, for intractable vomiting likely from cannabis use.    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  He was given supportive treatment with IV fluids, antiemetics, and improved.  Patient feels at baseline, able to tolerate diet, asking to go home, and was discharged home in stable condition.  He had mild diarrhea which is now resolved, C. difficile was  negative Hypokalemia -in the setting of GI losses, his potassium has been repleted prior to discharge  Discharge Diagnoses:  Active Problems:   Intractable vomiting   Cannabis abuse     Discharge Instructions   Allergies as of 12/17/2017   No Known Allergies     Medication List    STOP taking these medications   ondansetron 4 MG tablet Commonly known as:  ZOFRAN     TAKE these medications   bismuth subsalicylate 262 MG/15ML suspension Commonly known as:  PEPTO BISMOL Take 30 mLs by mouth once.   dicyclomine 20 MG tablet Commonly known as:  BENTYL Take 1 tablet (20 mg total) by mouth 2 (two) times daily.   ondansetron 4 MG disintegrating tablet Commonly known as:  ZOFRAN ODT Take 1 tablet (4 mg total) by mouth every 8 (eight) hours as needed for nausea or vomiting.   pantoprazole 40 MG tablet Commonly known as:  PROTONIX Take 1 tablet (40 mg total) by mouth daily.   ranitidine 75 MG tablet Commonly known as:  ZANTAC Take 75 mg by mouth once.      Follow-up Information    Leilani Able, MD. Schedule an appointment as soon as possible for a visit in 2 week(s).   Specialty:  Family Medicine Contact information: [address] [place] [address] [phone]           Consultations:  none  Procedures/Studies:  none  Ct Abdomen Pelvis W Contrast  Result Date: 12/14/2017 CLINICAL DATA:  Diffuse abdominal pain, nausea, vomiting and diarrhea since yesterday. EXAM: CT ABDOMEN AND PELVIS WITH CONTRAST TECHNIQUE: Multidetector CT imaging  of the abdomen and pelvis was performed using the standard protocol following bolus administration of intravenous contrast. CONTRAST:  100 cc Isovue-300 COMPARISON:  Abdomen pelvis radiograph dated 07/04/2015 and abdomen and pelvis CT dated 07/02/2015. FINDINGS: Lower chest: Stable bulla in the medial aspect of the right lower lobe. Stable posterolateral diaphragmatic hernia on the left containing herniated fat.  Hepatobiliary: No focal liver abnormality is seen. No gallstones, gallbladder wall thickening, or biliary dilatation. Pancreas: Unremarkable. No pancreatic ductal dilatation or surrounding inflammatory changes. Spleen: Normal in size without focal abnormality. Adrenals/Urinary Tract: Adrenal glands are unremarkable. Kidneys are normal, without renal calculi, focal lesion, or hydronephrosis. Bladder is unremarkable. Stomach/Bowel: Small hiatal hernia. Appendix appears normal. No evidence of bowel wall thickening, distention, or inflammatory changes. Vascular/Lymphatic: No significant vascular findings are present. No enlarged abdominal or pelvic lymph nodes. Reproductive: Prostate is unremarkable. Other: Small right inguinal hernia containing fat. Musculoskeletal: Unremarkable bones. IMPRESSION: 1. No acute abnormality. 2. Small hiatal hernia. Electronically Signed   By: Beckie Salts M.D.   On: 12/14/2017 16:55      Subjective: - no chest pain, shortness of breath, no abdominal pain, nausea or vomiting.   Discharge Exam: Vitals:   12/17/17 0104 12/17/17 0550  BP: 129/85 123/88  Pulse: (!) 48 67  Resp: 16 16  Temp: 99 F (37.2 C) 99.1 F (37.3 C)  SpO2: 96% 100%    General: Pt is alert, awake, not in acute distress Cardiovascular: RRR, S1/S2 +, no rubs, no gallops Respiratory: CTA bilaterally, no wheezing, no rhonchi Abdominal: Soft, NT, ND, bowel sounds + Extremities: no edema, no cyanosis    The results of significant diagnostics from this hospitalization (including imaging, microbiology, ancillary and laboratory) are listed below for reference.     Microbiology: Recent Results (from the past 240 hour(s))  C difficile quick scan w PCR reflex     Status: None   Collection Time: 12/17/17  4:03 AM  Result Value Ref Range Status   C Diff antigen NEGATIVE NEGATIVE Final   C Diff toxin NEGATIVE NEGATIVE Final   C Diff interpretation No C. difficile detected.  Final    Comment:  Performed at [facility], [address] [place], [place] [address]     Labs: BNP (last 3 results) No results for input(s): BNP in the last 8760 hours. Basic Metabolic Panel: Recent Labs  Lab 12/14/17 1315 12/16/17 1200 12/16/17 2117 12/17/17 0128  NA 138 138  --  137  K 3.9 2.9*  --  3.0*  CL 104 105  --  104  CO2 23 19*  --  26  GLUCOSE 120* 105*  --  128*  BUN 18 16  --  17  CREATININE 0.94 1.00  --  0.92  CALCIUM 10.3 9.1  --  8.5*  MG  --   --  2.0  --    Liver Function Tests: Recent Labs  Lab 12/14/17 1315 12/16/17 1200  AST 35 38  ALT 34 30  ALKPHOS 71 57  BILITOT 0.9 1.1  PROT 9.4* 7.6  ALBUMIN 5.0 4.3   Recent Labs  Lab 12/14/17 1315 12/16/17 1200  LIPASE 22 16   No results for input(s): AMMONIA in the last 168 hours. CBC: Recent Labs  Lab 12/14/17 1315 12/16/17 1200 12/17/17 0023 12/17/17 0128  WBC 12.8* 8.0 10.1 9.9  HGB 17.5* 14.8 13.3 13.1  HCT 49.6 42.1 38.0* 37.6*  MCV 88.3 87.9 86.6 87.6  PLT 205 181 153 160  Cardiac Enzymes: No results for input(s): CKTOTAL, CKMB, CKMBINDEX, TROPONINI in the last 168 hours. BNP: Invalid input(s): POCBNP CBG: No results for input(s): GLUCAP in the last 168 hours. D-Dimer No results for input(s): DDIMER in the last 72 hours. Hgb A1c No results for input(s): HGBA1C in the last 72 hours. Lipid Profile No results for input(s): CHOL, HDL, LDLCALC, TRIG, CHOLHDL, LDLDIRECT in the last 72 hours. Thyroid function studies No results for input(s): TSH, T4TOTAL, T3FREE, THYROIDAB in the last 72 hours.  Invalid input(s): FREET3 Anemia work up No results for input(s): VITAMINB12, FOLATE, FERRITIN, TIBC, IRON, RETICCTPCT in the last 72 hours. Urinalysis    Component Value Date/Time   COLORURINE YELLOW 12/17/2017 0040   APPEARANCEUR CLEAR 12/17/2017 0040   LABSPEC 1.032 (H) 12/17/2017 0040   PHURINE 6.0 12/17/2017 0040   GLUCOSEU NEGATIVE 12/17/2017 0040   HGBUR SMALL (A)  12/17/2017 0040   BILIRUBINUR NEGATIVE 12/17/2017 0040   KETONESUR 80 (A) 12/17/2017 0040   PROTEINUR NEGATIVE 12/17/2017 0040   UROBILINOGEN 0.2 08/31/2015 0114   NITRITE NEGATIVE 12/17/2017 0040   LEUKOCYTESUR NEGATIVE 12/17/2017 0040   Sepsis Labs Invalid input(s): PROCALCITONIN,  WBC,  LACTICIDVEN   Time coordinating discharge: 20 minutes  SIGNED:  Pamella Pertostin Lynleigh Kovack, MD  [facility] 12/17/2017, 3:41 PM Pager [phone]  If 7PM-7AM, please contact night-coverage [email or web address] Password [email or web address]

## 2017-12-17 NOTE — Discharge Instructions (Signed)
Follow with Leilani Ableeese, Betti, MD in 5-7 days  Please get a complete blood count and chemistry panel checked by your Primary MD at your next visit, and again as instructed by your Primary MD. Please get your medications reviewed and adjusted by your Primary MD.  Please request your Primary MD to go over all Hospital Tests and Procedure/Radiological results at the follow up, please get all Hospital records sent to your Prim MD by signing hospital release before you go home.  If you had Pneumonia of Lung problems at the Hospital: Please get a 2 view Chest X ray done in 6-8 weeks after hospital discharge or sooner if instructed by your Primary MD.  If you have Congestive Heart Failure: Please call your Cardiologist or Primary MD anytime you have any of the following symptoms:  1) 3 pound weight gain in 24 hours or 5 pounds in 1 week  2) shortness of breath, with or without a dry hacking cough  3) swelling in the hands, feet or stomach  4) if you have to sleep on extra pillows at night in order to breathe  Follow cardiac low salt diet and 1.5 lit/day fluid restriction.  If you have diabetes Accuchecks 4 times/day, Once in AM empty stomach and then before each meal. Log in all results and show them to your primary doctor at your next visit. If any glucose reading is under 80 or above 300 call your primary MD immediately.  If you have Seizure/Convulsions/Epilepsy: Please do not drive, operate heavy machinery, participate in activities at heights or participate in high speed sports until you have seen by Primary MD or a Neurologist and advised to do so again.  If you had Gastrointestinal Bleeding: Please ask your Primary MD to check a complete blood count within one week of discharge or at your next visit. Your endoscopic/colonoscopic biopsies that are pending at the time of discharge, will also need to followed by your Primary MD.  Get Medicines reviewed and adjusted. Please take all your  medications with you for your next visit with your Primary MD  Please request your Primary MD to go over all hospital tests and procedure/radiological results at the follow up, please ask your Primary MD to get all Hospital records sent to his/her office.  If you experience worsening of your admission symptoms, develop shortness of breath, life threatening emergency, suicidal or homicidal thoughts you must seek medical attention immediately by calling 911 or calling your MD immediately  if symptoms less severe.  You must read complete instructions/literature along with all the possible adverse reactions/side effects for all the Medicines you take and that have been prescribed to you. Take any new Medicines after you have completely understood and accpet all the possible adverse reactions/side effects.   Do not drive or operate heavy machinery when taking Pain medications.   Do not take more than prescribed Pain, Sleep and Anxiety Medications  Special Instructions: If you have smoked or chewed Tobacco  in the last 2 yrs please stop smoking, stop any regular Alcohol  and or any Recreational drug use.  Wear Seat belts while driving.  Please note You were cared for by a hospitalist during your hospital stay. If you have any questions about your discharge medications or the care you received while you were in the hospital after you are discharged, you can call the unit and asked to speak with the hospitalist on call if the hospitalist that took care of you is not available. Once  you are discharged, your primary care physician will handle any further medical issues. Please note that NO REFILLS for any discharge medications will be authorized once you are discharged, as it is imperative that you return to your primary care physician (or establish a relationship with a primary care physician if you do not have one) for your aftercare needs so that they can reassess your need for medications and monitor your  lab values.  You can reach the hospitalist office at phone [phone] or fax [phone]   If you do not have a primary care physician, you can call [phone] for a physician referral.  Activity: As tolerated with Full fall precautions use walker/cane & assistance as needed  Diet: regular  Disposition Home

## 2017-12-17 NOTE — ED Notes (Signed)
ED TO INPATIENT HANDOFF REPORT  Name/Age/Gender Bruce Shelton 32 y.o. male  Code Status Code Status History    Date Active Date Inactive Code Status Order ID Comments User Context   07/02/2015 05:39 07/03/2015 17:32 Full Code [id number]  Lavina Hamman, MD Inpatient      Home/SNF/Other Home  Chief Complaint vomiting   Level of Care/Admitting Diagnosis ED Disposition    ED Disposition Condition [place]: [facility] [[id number]]  Level of Care: Med-Surg [16]  Diagnosis: Intractable vomiting [242353]  Admitting Physician: Bethena Roys [[id number]]  Attending Physician: Bethena Roys [phone]  PT Class (Do Not Modify): Observation [104]  PT Acc Code (Do Not Modify): Observation [10022]       Medical History Past Medical History:  Diagnosis Date  . Hemorrhoid   . Hypokalemia     Allergies No Known Allergies  IV Location/Drains/Wounds Patient Lines/Drains/Airways Status   Active Line/Drains/Airways    Name:   Placement date:   Placement time:   Site:   Days:   Peripheral IV 12/17/17 Left Antecubital   12/17/17    0037    Antecubital   less than 1          Labs/Imaging Results for orders placed or performed during the hospital encounter of 12/16/17 (from the past 48 hour(s))  Lipase, blood     Status: None   Collection Time: 12/16/17 12:00 PM  Result Value Ref Range   Lipase 16 11 - 51 U/L    Comment: Performed at [facility], [place] [address] [place], [place] [address]  Comprehensive metabolic panel     Status: Abnormal   Collection Time: 12/16/17 12:00 PM  Result Value Ref Range   Sodium 138 135 - 145 mmol/L   Potassium 2.9 (L) 3.5 - 5.1 mmol/L    Comment: DELTA CHECK NOTED REPEATED TO VERIFY    Chloride 105 101 - 111 mmol/L   CO2 19 (L) 22 - 32 mmol/L   Glucose, Bld 105 (H) 65 - 99 mg/dL   BUN 16 6 - 20 mg/dL   Creatinine, Ser 1.00 0.61 - 1.24 mg/dL   Calcium 9.1 8.9 - 10.3 mg/dL    Total Protein 7.6 6.5 - 8.1 g/dL   Albumin 4.3 3.5 - 5.0 g/dL   AST 38 15 - 41 U/L   ALT 30 17 - 63 U/L   Alkaline Phosphatase 57 38 - 126 U/L   Total Bilirubin 1.1 0.3 - 1.2 mg/dL   GFR calc non Af Amer >60 >60 mL/min   GFR calc Af Amer >60 >60 mL/min    Comment: (NOTE) The eGFR has been calculated using the CKD EPI equation. This calculation has not been validated in all clinical situations. eGFR's persistently <60 mL/min signify possible Chronic Kidney Disease.    Anion gap 14 5 - 15    Comment: Performed at [facility], [place] [address] [place], [place] [address]  CBC     Status: None   Collection Time: 12/16/17 12:00 PM  Result Value Ref Range   WBC 8.0 4.0 - 10.5 K/uL   RBC 4.79 4.22 - 5.81 MIL/uL   Hemoglobin 14.8 13.0 - 17.0 g/dL   HCT 42.1 39.0 - 52.0 %   MCV 87.9 78.0 - 100.0 fL   MCH 30.9 26.0 - 34.0 pg   MCHC 35.2 30.0 - 36.0 g/dL   RDW 12.7 11.5 - 15.5 %   Platelets 181  150 - 400 K/uL    Comment: Performed at [facility], [place] [address], [place], [place] [address]  Magnesium     Status: None   Collection Time: 12/16/17  9:17 PM  Result Value Ref Range   Magnesium 2.0 1.7 - 2.4 mg/dL    Comment: Performed at [facility], [place] [address], [place], [place] [address]  CBC     Status: Abnormal   Collection Time: 12/17/17 12:23 AM  Result Value Ref Range   WBC 10.1 4.0 - 10.5 K/uL   RBC 4.39 4.22 - 5.81 MIL/uL   Hemoglobin 13.3 13.0 - 17.0 g/dL   HCT 38.0 (L) 39.0 - 52.0 %   MCV 86.6 78.0 - 100.0 fL   MCH 30.3 26.0 - 34.0 pg   MCHC 35.0 30.0 - 36.0 g/dL   RDW 12.9 11.5 - 15.5 %   Platelets 153 150 - 400 K/uL    Comment: Performed at [facility], [place] [address], [place],  [address]   No results found.  Pending Labs [employer] (From admission, onward)   Start     Ordered   12/17/17 0041  C difficile quick scan w PCR reflex  (C Difficile quick screen w PCR reflex panel)   Once, for 48 hours,   R    Comments:  Laxatives (last 72 hours)   None     Question Answer Comment  Is your patient experiencing loose or watery stools (3 or more in 24 hours)? Yes   Has the patient received laxatives in the last 24 hours? No   Has a negative Cdiff test resulted in the last 7 days? No      12/17/17 0041   12/16/17 2109  Rapid urine drug screen (hospital performed)  STAT,   R     12/16/17 2110   12/16/17 1151  Urinalysis, Routine w reflex microscopic  STAT,   STAT     12/16/17 1151   Signed and Held  HIV antibody (Routine Testing)  Once,   R     Signed and Held   Signed and Held  Basic metabolic panel  Tomorrow morning,   R     Signed and Held   Signed and Held  CBC  Tomorrow morning,   R     Signed and Held   Signed and Held  Occult blood gastric / duodenum  Once,   R     Signed and Held      Vitals/Pain Today's Vitals   12/16/17 1943 12/17/17 0037 12/17/17 0042 12/17/17 0042  BP: (!) 144/104 (!) 127/93    Pulse: 61 85    Resp: 18 15    Temp:      TempSrc:      SpO2: 100% 100% 100%   PainSc:  7   7     Isolation Precautions Enteric precautions (UV disinfection)  Medications Medications  pantoprazole (PROTONIX) injection 40 mg (40 mg Intravenous Given 12/17/17 0030)  ondansetron (ZOFRAN-ODT) disintegrating tablet 4 mg (4 mg Oral Given 12/16/17 1155)  ondansetron (ZOFRAN-ODT) disintegrating tablet 4 mg (4 mg Oral Given 12/16/17 1533)  ondansetron (ZOFRAN-ODT) disintegrating tablet 4 mg (4 mg Oral Given 12/16/17 2339)  haloperidol lactate (HALDOL) injection 5 mg (5 mg Intravenous Given 12/16/17 2136)  capsaicin (ZOSTRIX) 0.025 % cream ( Topical Given 12/16/17 2142)  potassium chloride 10 mEq in 100 mL IVPB (0 mEq Intravenous Stopped 12/16/17 2338)  sodium chloride 0.9 % bolus 1,000  mL (0 mLs Intravenous Stopped 12/16/17 2224)  potassium chloride SA (K-DUR,KLOR-CON) CR tablet 40 mEq (40 mEq Oral Given 12/17/17 0030)    Mobility Walks

## 2018-03-04 ENCOUNTER — Ambulatory Visit: Payer: Self-pay | Admitting: General Surgery

## 2018-04-03 ENCOUNTER — Other Ambulatory Visit: Payer: Self-pay

## 2018-04-03 ENCOUNTER — Encounter (HOSPITAL_BASED_OUTPATIENT_CLINIC_OR_DEPARTMENT_OTHER): Payer: Self-pay | Admitting: *Deleted

## 2018-04-03 NOTE — Pre-Procedure Instructions (Signed)
Pt plans to come Wednesday to pick up  Ensure drink.

## 2018-04-08 NOTE — Progress Notes (Signed)
Pt given Ensure drink with instructions for completion by 0745 DOS. Also given hiblicins scrub with instructions on wash. Pt verbalized understanding.

## 2018-04-09 ENCOUNTER — Ambulatory Visit (HOSPITAL_BASED_OUTPATIENT_CLINIC_OR_DEPARTMENT_OTHER)
Admission: RE | Admit: 2018-04-09 | Payer: Managed Care, Other (non HMO) | Source: Ambulatory Visit | Admitting: General Surgery

## 2018-04-09 SURGERY — REPAIR, HERNIA, INGUINAL, ADULT
Anesthesia: General | Laterality: Right

## 2018-04-21 ENCOUNTER — Emergency Department (HOSPITAL_COMMUNITY)
Admission: EM | Admit: 2018-04-21 | Discharge: 2018-04-21 | Disposition: A | Discharge status: Home / Self Care | Payer: Managed Care, Other (non HMO) | Attending: Emergency Medicine | Admitting: Emergency Medicine

## 2018-04-21 ENCOUNTER — Encounter (HOSPITAL_COMMUNITY): Payer: Self-pay | Admitting: *Deleted

## 2018-04-21 DIAGNOSIS — G8918 Other acute postprocedural pain: Secondary | ICD-10-CM | POA: Diagnosis present

## 2018-04-21 DIAGNOSIS — R112 Nausea with vomiting, unspecified: Secondary | ICD-10-CM | POA: Insufficient documentation

## 2018-04-21 DIAGNOSIS — R1110 Vomiting, unspecified: Secondary | ICD-10-CM

## 2018-04-21 DIAGNOSIS — R1013 Epigastric pain: Secondary | ICD-10-CM | POA: Insufficient documentation

## 2018-04-21 LAB — URINALYSIS, ROUTINE W REFLEX MICROSCOPIC
BILIRUBIN URINE: NEGATIVE
GLUCOSE, UA: NEGATIVE mg/dL
HGB URINE DIPSTICK: NEGATIVE
Ketones, ur: 80 mg/dL — AB
Leukocytes, UA: NEGATIVE
NITRITE: NEGATIVE
PH: 7 (ref 5.0–8.0)
PROTEIN: 30 mg/dL — AB
Specific Gravity, Urine: 1.028 (ref 1.005–1.030)

## 2018-04-21 LAB — CBC WITH DIFFERENTIAL/PLATELET
BASOS PCT: 0 %
Basophils Absolute: 0 10*3/uL (ref 0.0–0.1)
Eosinophils Absolute: 0 10*3/uL (ref 0.0–0.7)
Eosinophils Relative: 0 %
HEMATOCRIT: 43.4 % (ref 39.0–52.0)
HEMOGLOBIN: 15.4 g/dL (ref 13.0–17.0)
LYMPHS ABS: 1.3 10*3/uL (ref 0.7–4.0)
Lymphocytes Relative: 8 %
MCH: 31.4 pg (ref 26.0–34.0)
MCHC: 35.5 g/dL (ref 30.0–36.0)
MCV: 88.4 fL (ref 78.0–100.0)
MONOS PCT: 11 %
Monocytes Absolute: 1.8 10*3/uL — ABNORMAL HIGH (ref 0.1–1.0)
NEUTROS ABS: 13.4 10*3/uL — AB (ref 1.7–7.7)
NEUTROS PCT: 81 %
Platelets: 198 10*3/uL (ref 150–400)
RBC: 4.91 MIL/uL (ref 4.22–5.81)
RDW: 12.3 % (ref 11.5–15.5)
WBC: 16.5 10*3/uL — ABNORMAL HIGH (ref 4.0–10.5)

## 2018-04-21 LAB — BASIC METABOLIC PANEL
Anion gap: 12 (ref 5–15)
BUN: 16 mg/dL (ref 6–20)
CHLORIDE: 102 mmol/L (ref 101–111)
CO2: 25 mmol/L (ref 22–32)
CREATININE: 0.91 mg/dL (ref 0.61–1.24)
Calcium: 9.3 mg/dL (ref 8.9–10.3)
GFR calc non Af Amer: >60 mL/min (ref >60)
Glucose, Bld: 136 mg/dL — ABNORMAL HIGH (ref 65–99)
Potassium: 3.4 mmol/L — ABNORMAL LOW (ref 3.5–5.1)
Sodium: 139 mmol/L (ref 135–145)

## 2018-04-21 LAB — LIPASE, BLOOD: Lipase: 19 U/L (ref 11–51)

## 2018-04-21 MED ORDER — METOCLOPRAMIDE HCL 10 MG PO TABS
10.0000 mg | ORAL_TABLET | Freq: Once | ORAL | Status: AC
  Administered 2018-04-21: 10 mg via ORAL
  Filled 2018-04-21: qty 1

## 2018-04-21 MED ORDER — METOCLOPRAMIDE HCL 10 MG PO TABS: 10.0000 mg | ORAL_TABLET | Freq: Four times a day (QID) | ORAL | 0 refills | Status: DC | PRN

## 2018-04-21 MED ORDER — HYDROMORPHONE HCL 1 MG/ML IJ SOLN
1.0000 mg | Freq: Once | INTRAMUSCULAR | Status: AC
  Administered 2018-04-21: 1 mg via INTRAVENOUS
  Filled 2018-04-21: qty 1

## 2018-04-21 MED ORDER — METOCLOPRAMIDE HCL 5 MG/ML IJ SOLN
10.0000 mg | Freq: Once | INTRAMUSCULAR | Status: AC
  Administered 2018-04-21: 10 mg via INTRAVENOUS
  Filled 2018-04-21: qty 2

## 2018-04-21 MED ORDER — LACTATED RINGERS IV BOLUS
2000.0000 mL | Freq: Once | INTRAVENOUS | Status: AC
  Administered 2018-04-21: 2000 mL via INTRAVENOUS

## 2018-04-21 NOTE — ED Provider Notes (Signed)
Emergency Department Provider Note   I have reviewed the triage vital signs and the nursing notes.   HISTORY  Chief Complaint Abdominal Pain and Nausea   HPI Bruce Shelton is a 32 y.o. male who had a hernia repair for inguinal hernia yesterday at the surgery center in [place] and afterwards had a nausea and approximately 15 episodes of vomiting since that time.  Initially started stomach contacts but now is more of a dark color.  Has multiple episodes of dry heaving as well.  No fevers.  Has pain that seems to be worse in his epigastric and upper quadrant areas.  No significant pain in his lower lower quadrants.  Expected pain around his incisional area.  No sick contacts.  Has not had a bowel movement since surgery.  Got his pain medication filled from there but did not get any nausea medicine.  Has not taken any of his pain medicine because he cannot tolerate p.o. at this time. No other associated or modifying symptoms.    Past Medical History:  Diagnosis Date  . Hemorrhoid   . Hypokalemia     Patient Active Problem List   Diagnosis Date Noted  . Intractable vomiting 12/16/2017  . Cannabis abuse 12/16/2017  . Dehydration 07/02/2015  . AKI (acute kidney injury) (HCC) 07/02/2015  . Hemorrhoid     Past Surgical History:  Procedure Laterality Date  . HEMORRHOID SURGERY N/A 07/03/2015   Procedure: Exam under anesthesia, HEMORRHOIDECTOMY;  Surgeon: Avel Peace, MD;  Location: [facility];  Service: General;  Laterality: N/A;  . HERNIA REPAIR    . WISDOM TOOTH EXTRACTION        Allergies Patient has no known allergies.  No family history on file.  Social History Social History   Tobacco Use  . Smoking status: Never Smoker  . Smokeless tobacco: Never Used  Substance Use Topics  . Alcohol use: No  . Drug use: Yes    Types: Marijuana    Comment: Smoked Yesterday    Review of Systems  All other systems negative except as documented in the HPI. All pertinent  positives and negatives as reviewed in the HPI. ____________________________________________   PHYSICAL EXAM:  VITAL SIGNS: ED Triage Vitals  Enc Vitals Group     BP 04/21/18 0811 136/89     Pulse Rate 04/21/18 0811 64     Resp 04/21/18 0811 18     Temp 04/21/18 0811 98.9 F (37.2 C)     Temp Source 04/21/18 0811 Oral     SpO2 04/21/18 0811 100 %    Constitutional: Alert and oriented. Well appearing and in no acute distress. Eyes: Conjunctivae are normal. PERRL. EOMI. Head: Atraumatic. Nose: No congestion/rhinnorhea. Mouth/Throat: Mucous membranes are moist.  Oropharynx non-erythematous. Neck: No stridor.  No meningeal signs.   Cardiovascular: Normal rate, regular rhythm. Good peripheral circulation. Grossly normal heart sounds.   Respiratory: Normal respiratory effort.  No retractions. Lungs CTAB. Gastrointestinal: Soft and nontender. No distention.  Musculoskeletal: No lower extremity tenderness nor edema. No gross deformities of extremities. Neurologic:  Normal speech and language. No gross focal neurologic deficits are appreciated.  Skin:  Skin is warm, dry. Wound c/d/i with overlying tegaderm. No rash noted.  ____________________________________________   LABS (all labs ordered are listed, but only abnormal results are displayed)  Labs Reviewed  CBC WITH DIFFERENTIAL/PLATELET - Abnormal; Notable for the following components:      Result Value   WBC 16.5 (*)    Neutro Abs 13.4 (*)  Monocytes Absolute 1.8 (*)    All other components within normal limits  BASIC METABOLIC PANEL - Abnormal; Notable for the following components:   Potassium 3.4 (*)    Glucose, Bld 136 (*)    All other components within normal limits  URINALYSIS, ROUTINE W REFLEX MICROSCOPIC - Abnormal; Notable for the following components:   Ketones, ur 80 (*)    Protein, ur 30 (*)    Bacteria, UA RARE (*)    All other components within normal limits  LIPASE, BLOOD    ____________________________________________  RADIOLOGY  No results found.  ____________________________________________   PROCEDURES  Procedure(s) performed:   Procedures   ____________________________________________   INITIAL IMPRESSION / ASSESSMENT AND PLAN / ED COURSE  Post op nausea/vomiting. Will hydrate. Anti-emetics, pain meds, reeval for disposition.   Clinical Course as of Apr 21 1549  Tue Apr 21, 2018  1005 Dehydration likely  Ketones, ur(!): 80 [JM]  1005 Not too bad compared to previous episodes  Potassium(!): 3.4 [JM]  1006 Likely reactive from surgery  WBC(!): 16.5 [JM]  1006 Probably not significantly dehydrated  Pulse Rate: 67 [JM]    Clinical Course User Index [JM] Karrisa Didio, Barbara CowerJason, MD   Improved nausea vomiting. Pain improved. Discussed with surgery, ok with no repeat imaging. Patient tolerating PO. Pain controlled then can go home. Will follow up as scheduled.  Pertinent labs & imaging results that were available during my care of the patient were reviewed by me and considered in my medical decision making (see chart for details).  ____________________________________________  FINAL CLINICAL IMPRESSION(S) / ED DIAGNOSES  Final diagnoses:  Vomiting, intractability of vomiting not specified, presence of nausea not specified, unspecified vomiting type     MEDICATIONS GIVEN DURING THIS VISIT:  Medications  lactated ringers bolus 2,000 mL (0 mLs Intravenous Stopped 04/21/18 1121)  HYDROmorphone (DILAUDID) injection 1 mg (1 mg Intravenous Given 04/21/18 0909)  metoCLOPramide (REGLAN) injection 10 mg (10 mg Intravenous Given 04/21/18 0909)  metoCLOPramide (REGLAN) tablet 10 mg (10 mg Oral Given 04/21/18 1416)     NEW OUTPATIENT MEDICATIONS STARTED DURING THIS VISIT:  Discharge Medication List as of 04/21/2018  1:30 PM    START taking these medications   Details  metoCLOPramide (REGLAN) 10 MG tablet Take 1 tablet (10 mg total) by mouth  every 6 (six) hours as needed for nausea (nausea/headache)., Starting Tue 04/21/2018, Print        Note:  This note was prepared with assistance of Dragon voice recognition software. Occasional wrong-word or sound-a-like substitutions may have occurred due to the inherent limitations of voice recognition software.   Marily MemosMesner, Brailee Riede, MD 04/21/18 [phone]

## 2018-04-21 NOTE — ED Triage Notes (Signed)
Per EMS, pt had hernia surgery yesterday. Pt felt nauseas upon waking up from surgery, pt was not given zofran from the encounter. Pt received 4mg  zofran.  BP 140/90 HR 80

## 2018-04-25 ENCOUNTER — Emergency Department (HOSPITAL_COMMUNITY)
Admission: EM | Admit: 2018-04-25 | Discharge: 2018-04-25 | Disposition: A | Discharge status: Home / Self Care | Payer: Managed Care, Other (non HMO) | Attending: Emergency Medicine | Admitting: Emergency Medicine

## 2018-04-25 ENCOUNTER — Encounter (HOSPITAL_COMMUNITY): Payer: Self-pay | Admitting: Emergency Medicine

## 2018-04-25 ENCOUNTER — Other Ambulatory Visit: Payer: Self-pay

## 2018-04-25 DIAGNOSIS — R112 Nausea with vomiting, unspecified: Secondary | ICD-10-CM | POA: Insufficient documentation

## 2018-04-25 LAB — URINALYSIS, ROUTINE W REFLEX MICROSCOPIC
BILIRUBIN URINE: NEGATIVE
GLUCOSE, UA: NEGATIVE mg/dL
HGB URINE DIPSTICK: NEGATIVE
KETONES UR: 5 mg/dL — AB
LEUKOCYTES UA: NEGATIVE
NITRITE: NEGATIVE
PH: 5 (ref 5.0–8.0)
Protein, ur: 30 mg/dL — AB
SPECIFIC GRAVITY, URINE: 1.032 — AB (ref 1.005–1.030)

## 2018-04-25 LAB — COMPREHENSIVE METABOLIC PANEL
ALT: 46 U/L (ref 17–63)
AST: 52 U/L — AB (ref 15–41)
Albumin: 4.7 g/dL (ref 3.5–5.0)
Alkaline Phosphatase: 64 U/L (ref 38–126)
Anion gap: 12 (ref 5–15)
BILIRUBIN TOTAL: 2 mg/dL — AB (ref 0.3–1.2)
BUN: 22 mg/dL — AB (ref 6–20)
CALCIUM: 9.8 mg/dL (ref 8.9–10.3)
CO2: 31 mmol/L (ref 22–32)
Chloride: 93 mmol/L — ABNORMAL LOW (ref 101–111)
Creatinine, Ser: 1.21 mg/dL (ref 0.61–1.24)
GFR calc Af Amer: >60 mL/min (ref >60)
GFR calc non Af Amer: >60 mL/min (ref >60)
Glucose, Bld: 131 mg/dL — ABNORMAL HIGH (ref 65–99)
POTASSIUM: 3.6 mmol/L (ref 3.5–5.1)
Sodium: 136 mmol/L (ref 135–145)
TOTAL PROTEIN: 9.6 g/dL — AB (ref 6.5–8.1)

## 2018-04-25 LAB — CBC
HEMATOCRIT: 47.3 % (ref 39.0–52.0)
Hemoglobin: 16.8 g/dL (ref 13.0–17.0)
MCH: 31.6 pg (ref 26.0–34.0)
MCHC: 35.5 g/dL (ref 30.0–36.0)
MCV: 88.9 fL (ref 78.0–100.0)
PLATELETS: 224 10*3/uL (ref 150–400)
RBC: 5.32 MIL/uL (ref 4.22–5.81)
RDW: 12.4 % (ref 11.5–15.5)
WBC: 9.8 10*3/uL (ref 4.0–10.5)

## 2018-04-25 LAB — OCCULT BLOOD GASTRIC / DUODENUM (SPECIMEN CUP): Occult Blood, Gastric: NEGATIVE

## 2018-04-25 LAB — LIPASE, BLOOD: Lipase: 21 U/L (ref 11–51)

## 2018-04-25 MED ORDER — PANTOPRAZOLE SODIUM 20 MG PO TBEC: 20.0000 mg | DELAYED_RELEASE_TABLET | Freq: Every day | ORAL | 0 refills | Status: DC

## 2018-04-25 MED ORDER — PROMETHAZINE HCL 25 MG RE SUPP: 25.0000 mg | Freq: Four times a day (QID) | RECTAL | 0 refills | Status: DC | PRN

## 2018-04-25 MED ORDER — SUCRALFATE 1 GM/10ML PO SUSP: 1.0000 g | Freq: Three times a day (TID) | ORAL | 0 refills | Status: DC

## 2018-04-25 MED ORDER — DIPHENHYDRAMINE HCL 50 MG/ML IJ SOLN
25.0000 mg | Freq: Once | INTRAMUSCULAR | Status: AC
  Administered 2018-04-25: 25 mg via INTRAVENOUS
  Filled 2018-04-25: qty 1

## 2018-04-25 MED ORDER — GI COCKTAIL ~~LOC~~
30.0000 mL | Freq: Once | ORAL | Status: AC
  Administered 2018-04-25: 30 mL via ORAL
  Filled 2018-04-25: qty 30

## 2018-04-25 MED ORDER — SODIUM CHLORIDE 0.9 % IV BOLUS
1000.0000 mL | Freq: Once | INTRAVENOUS | Status: AC
  Administered 2018-04-25: 1000 mL via INTRAVENOUS

## 2018-04-25 MED ORDER — PROMETHAZINE HCL 25 MG/ML IJ SOLN
25.0000 mg | Freq: Once | INTRAMUSCULAR | Status: AC
  Administered 2018-04-25: 25 mg via INTRAVENOUS
  Filled 2018-04-25: qty 1

## 2018-04-25 NOTE — ED Provider Notes (Signed)
[facility]-EMERGENCY DEPT Provider Note   CSN: [id number] Arrival date & time: 04/25/18  4098     History   Chief Complaint Chief Complaint  Patient presents with  . Emesis    HPI Bruce Shelton is a 33 y.o. male.  HPI  Patient is a 32 year old male with a history of hemorrhoids, and intractable vomiting as well as recent right inguinal hernia repair with mesh placement on 6/08-2018 presenting for nausea vomiting.  Patient reports that he has not been able to keep down any p.o. intake for the past 4 days.  Patient reports he has been unable to tolerate his pain medication or antiemetics.  Patient reports he has noticed some dark red color to his emesis, but denies any bilious vomiting.  No bright red blood.  Patient denies any increased pain around his incisional site, or abdominal pain that has progressed or changed since surgery.  Patient denies any epigastric pain.  Patient had first bowel movement yesterday since surgery, and it was hard and liquid.  Patient reports he is passing flatus.  Patient reports regular marijuana use, but denies cannabis use since surgery.  Past Medical History:  Diagnosis Date  . Hemorrhoid   . Hypokalemia     Patient Active Problem List   Diagnosis Date Noted  . Intractable vomiting 12/16/2017  . Cannabis abuse 12/16/2017  . Dehydration 07/02/2015  . AKI (acute kidney injury) (HCC) 07/02/2015  . Hemorrhoid     Past Surgical History:  Procedure Laterality Date  . HEMORRHOID SURGERY N/A 07/03/2015   Procedure: Exam under anesthesia, HEMORRHOIDECTOMY;  Surgeon: Avel Peace, MD;  Location: [facility];  Service: General;  Laterality: N/A;  . HERNIA REPAIR    . WISDOM TOOTH EXTRACTION          Home Medications    Prior to Admission medications   Medication Sig Start Date End Date Taking? Authorizing Provider  HYDROcodone-acetaminophen (NORCO/VICODIN) 5-325 MG tablet Take 1-2 tablets by mouth every 6 (six) hours as  needed for moderate pain or severe pain.  04/20/18   [provider]  metoCLOPramide (REGLAN) 10 MG tablet Take 1 tablet (10 mg total) by mouth every 6 (six) hours as needed for nausea (nausea/headache). 04/21/18   Mesner, Barbara Cower, MD    Family History History reviewed. No pertinent family history.  Social History Social History   Tobacco Use  . Smoking status: Never Smoker  . Smokeless tobacco: Never Used  Substance Use Topics  . Alcohol use: No  . Drug use: Yes    Types: Marijuana    Comment: Smoked Yesterday     Allergies   Patient has no known allergies.   Review of Systems Review of Systems  Constitutional: Negative for chills and fever.  Respiratory: Negative for shortness of breath.   Cardiovascular: Negative for chest pain.  Gastrointestinal: Positive for abdominal pain, nausea and vomiting. Negative for abdominal distention, blood in stool and constipation.  Genitourinary: Negative for dysuria.  Musculoskeletal: Negative for arthralgias, back pain and myalgias.  Neurological: Negative for weakness and numbness.  All other systems reviewed and are negative.    Physical Exam Updated Vital Signs BP (!) 126/92 (BP Location: Left Arm)   Pulse 68   Temp 98.4 F (36.9 C) (Oral)   Resp 18   Ht 6\' 1"  (1.854 m)   Wt 73.7 kg (162 lb 6.4 oz)   SpO2 99%   BMI 21.43 kg/m   Physical Exam  Constitutional: He appears well-developed and well-nourished.  No distress.  HENT:  Head: Normocephalic and atraumatic.  Mouth/Throat: Oropharynx is clear and moist.  Eyes: Pupils are equal, round, and reactive to light. Conjunctivae and EOM are normal.  Neck: Normal range of motion. Neck supple.  Cardiovascular: Normal rate, regular rhythm, S1 normal and S2 normal.  No murmur heard. Pulmonary/Chest: Effort normal and breath sounds normal. He has no wheezes. He has no rales.  Abdominal: Soft. He exhibits no distension. There is tenderness. There is no guarding.  Slightly  hypoactive bowel sounds in all 4 quadrants and epigastrium. Patient has mild tenderness around the surgical site of the right inguinal region, only reproducible on palpation, and resolves when palpation ceases.  No other abdominal tenderness.  Musculoskeletal: Normal range of motion. He exhibits no edema or deformity.  Lymphadenopathy:    He has no cervical adenopathy.  Neurological: He is alert.  Cranial nerves grossly intact. Patient moves extremities symmetrically and with good coordination.  Skin: Skin is warm and dry. No rash noted. No erythema.  Psychiatric: He has a normal mood and affect. His behavior is normal. Judgment and thought content normal.  Nursing note and vitals reviewed.    ED Treatments / Results  Labs (all labs ordered are listed, but only abnormal results are displayed) Labs Reviewed  COMPREHENSIVE METABOLIC PANEL - Abnormal; Notable for the following components:      Result Value   Chloride 93 (*)    Glucose, Bld 131 (*)    BUN 22 (*)    Total Protein 9.6 (*)    AST 52 (*)    Total Bilirubin 2.0 (*)    All other components within normal limits  URINALYSIS, ROUTINE W REFLEX MICROSCOPIC - Abnormal; Notable for the following components:   Color, Urine AMBER (*)    APPearance HAZY (*)    Specific Gravity, Urine 1.032 (*)    Ketones, ur 5 (*)    Protein, ur 30 (*)    Bacteria, UA RARE (*)    All other components within normal limits  LIPASE, BLOOD  CBC  OCCULT BLOOD GASTRIC / DUODENUM (SPECIMEN CUP)    EKG None  Radiology No results found.  Procedures Procedures (including critical care time)  Medications Ordered in ED Medications  sodium chloride 0.9 % bolus 1,000 mL (has no administration in time range)  promethazine (PHENERGAN) injection 25 mg (has no administration in time range)  diphenhydrAMINE (BENADRYL) injection 25 mg (has no administration in time range)     Initial Impression / Assessment and Plan / ED Course  I have reviewed the  triage vital signs and the nursing notes.  Pertinent labs & imaging results that were available during my care of the patient were reviewed by me and considered in my medical decision making (see chart for details).  Clinical Course as of Apr 25 1652  Sat Apr 25, 2018  0906 Leukocytosis improved since evaluation on 04-21-2018.  WBC: 9.8 [AM]  0906 Hypochloremia likely due to vomiting.  Normal potassium.  No anion gap.  Chloride(!): 93 [AM]  0906 Ketones far less than evaluation 4 days ago.  Ketones, ur(!): 5 [AM]  [id number] Reassessed.  Tolerated GI cocktail.   [AM]  1005 Spoke with Dr. Gerrit Friends of general surgery who will let Dr. Carolynne Edouard know about patient visit.  Discussed prescribing patient rectal Phenergan.  Appreciate Dr. Sid Falcon' consult in regards to this patient case.   [AM]    Clinical Course User Index [AM] Elisha Ponder, PA-C    Patient  nontoxic-appearing, afebrile, and hemodynamically stable.  Heart rate 68, and patient is not on any beta-blockers any other medications that would slow rate and mask hypovolemia.  Volume status appears appropriate.  Will provide IV fluids, check basic lab work, and reassess after antiemetics.   Lab work overall improved from 4 days ago.  Patient tolerating p.o. with GI cocktail as well as liquids in emergency department.  Stable for discharge with rectal Phenergan.  Patient to follow-up with general surgery.  Patient given return precautions for any retractile nausea or vomiting, fevers with abdominal pain, or severe incisional site pain.  Patient is in understanding and agrees with the plan of care.  Final Clinical Impressions(s) / ED Diagnoses   Final diagnoses:  Non-intractable vomiting with nausea, unspecified vomiting type    ED Discharge Orders        Ordered    promethazine (PHENERGAN) 25 MG suppository  Every 6 hours PRN     04/25/18 1007    sucralfate (CARAFATE) 1 GM/10ML suspension  3 times daily with meals & bedtime     04/25/18 1007     pantoprazole (PROTONIX) 20 MG tablet  Daily     04/25/18 1015       Elisha PonderMurray, Alyssa B, PA-C 04/25/18 1654    Melene PlanFloyd, Dan, DO 04/26/18 [phone]

## 2018-04-25 NOTE — ED Notes (Signed)
Ginger ale given to pt.  

## 2018-04-25 NOTE — ED Notes (Signed)
ED Provider at bedside. 

## 2018-04-25 NOTE — ED Triage Notes (Signed)
Patient complaining vomiting since Monday. Patient has a hx of vomiting. Patient had zantac, dicyclomine, Protonix, and Pepto bismol prescribed in February. Patient has not ate since Monday per patient. Patient states his stomach is hurting. Patient had surgery on hernia 04/09/2018. Patient has not been taking any of the medications.

## 2018-04-25 NOTE — Discharge Instructions (Signed)
Please see the information and instructions below regarding your visit.  Your diagnoses today include:  1. Non-intractable vomiting with nausea, unspecified vomiting type     Tests performed today include: See side panel of your discharge paperwork for testing performed today. Vital signs are listed at the bottom of these instructions.   Your lab work is improved today.  Medications prescribed:    Take any prescribed medications only as prescribed, and any over the counter medications only as directed on the packaging.  Please use rectal Phenergan if you are unable to take anything by mouth.  You may apply 1 suppository rectally every 6 hours as needed for nausea and vomiting.  Do not also take oral Phenergan at the same time.  You may either take oral or rectal Phenergan every 6 hours.  You are prescribed Carafate.  This is a medicine to coat the lining of the stomach.  He may take 10 mL up to 4 times a day.  Home care instructions:  Please follow any educational materials contained in this packet.   Follow-up instructions: Please follow-up with your primary care provider for further evaluation of your symptoms if they are not completely improved.   Please follow up with  Dr. Carolynne Edouardoth per your previously scheduled appt.  Return instructions:  Please return to the Emergency Department if you experience worsening symptoms.  Please return to the emergency department if you develop any worsening nausea or vomiting, severe abdominal pain, fevers or chills, or new or worsening symptoms. Please return if you have any other emergent concerns.  Additional Information:   Your vital signs today were: BP (!) 131/98    Pulse 80    Temp 98.4 F (36.9 C) (Oral)    Resp 15    Ht 6\' 1"  (1.854 m)    Wt 73.7 kg (162 lb 6.4 oz)    SpO2 100%    BMI 21.43 kg/m  If your blood pressure (BP) was elevated on multiple readings during this visit above 130 for the top number or above 80 for the bottom number,  please have this repeated by your primary care provider within one month. --------------  Thank you for allowing us to participate in your care today.

## 2018-04-25 NOTE — ED Notes (Signed)
Gastric pH is 5 and negative for blood in ED lab

## 2018-04-27 ENCOUNTER — Emergency Department (HOSPITAL_COMMUNITY)
Admission: EM | Admit: 2018-04-27 | Discharge: 2018-04-27 | Disposition: A | Discharge status: Home / Self Care | Payer: Managed Care, Other (non HMO) | Attending: Emergency Medicine | Admitting: Emergency Medicine

## 2018-04-27 ENCOUNTER — Encounter (HOSPITAL_COMMUNITY): Payer: Self-pay | Admitting: Emergency Medicine

## 2018-04-27 ENCOUNTER — Emergency Department (HOSPITAL_COMMUNITY): Payer: Managed Care, Other (non HMO)

## 2018-04-27 DIAGNOSIS — F1290 Cannabis use, unspecified, uncomplicated: Secondary | ICD-10-CM

## 2018-04-27 DIAGNOSIS — R112 Nausea with vomiting, unspecified: Secondary | ICD-10-CM

## 2018-04-27 DIAGNOSIS — F12988 Cannabis use, unspecified with other cannabis-induced disorder: Secondary | ICD-10-CM | POA: Insufficient documentation

## 2018-04-27 DIAGNOSIS — R1031 Right lower quadrant pain: Secondary | ICD-10-CM | POA: Insufficient documentation

## 2018-04-27 LAB — URINALYSIS, ROUTINE W REFLEX MICROSCOPIC
Bilirubin Urine: NEGATIVE
Glucose, UA: NEGATIVE mg/dL
Hgb urine dipstick: NEGATIVE
Ketones, ur: 5 mg/dL — AB
LEUKOCYTES UA: NEGATIVE
NITRITE: NEGATIVE
PROTEIN: NEGATIVE mg/dL
Specific Gravity, Urine: 1.029 (ref 1.005–1.030)
pH: 5 (ref 5.0–8.0)

## 2018-04-27 LAB — COMPREHENSIVE METABOLIC PANEL
ALT: 81 U/L — ABNORMAL HIGH (ref 17–63)
AST: 55 U/L — ABNORMAL HIGH (ref 15–41)
Albumin: 4 g/dL (ref 3.5–5.0)
Alkaline Phosphatase: 56 U/L (ref 38–126)
Anion gap: 12 (ref 5–15)
BUN: 18 mg/dL (ref 6–20)
CHLORIDE: 96 mmol/L — AB (ref 101–111)
CO2: 29 mmol/L (ref 22–32)
CREATININE: 0.99 mg/dL (ref 0.61–1.24)
Calcium: 9.3 mg/dL (ref 8.9–10.3)
Glucose, Bld: 100 mg/dL — ABNORMAL HIGH (ref 65–99)
Potassium: 3.5 mmol/L (ref 3.5–5.1)
Sodium: 137 mmol/L (ref 135–145)
Total Bilirubin: 1.5 mg/dL — ABNORMAL HIGH (ref 0.3–1.2)
Total Protein: 8 g/dL (ref 6.5–8.1)

## 2018-04-27 LAB — RAPID URINE DRUG SCREEN, HOSP PERFORMED
AMPHETAMINES: NOT DETECTED
BENZODIAZEPINES: NOT DETECTED
COCAINE: NOT DETECTED
OPIATES: NOT DETECTED
Tetrahydrocannabinol: POSITIVE — AB

## 2018-04-27 LAB — CBC
HCT: 43.4 % (ref 39.0–52.0)
Hemoglobin: 15.1 g/dL (ref 13.0–17.0)
MCH: 31 pg (ref 26.0–34.0)
MCHC: 34.8 g/dL (ref 30.0–36.0)
MCV: 89.1 fL (ref 78.0–100.0)
PLATELETS: 206 10*3/uL (ref 150–400)
RBC: 4.87 MIL/uL (ref 4.22–5.81)
RDW: 11.8 % (ref 11.5–15.5)
WBC: 8.1 10*3/uL (ref 4.0–10.5)

## 2018-04-27 LAB — LIPASE, BLOOD: Lipase: 25 U/L (ref 11–51)

## 2018-04-27 MED ORDER — IOPAMIDOL (ISOVUE-300) INJECTION 61%
INTRAVENOUS | Status: AC
  Filled 2018-04-27: qty 100

## 2018-04-27 MED ORDER — HALOPERIDOL LACTATE 5 MG/ML IJ SOLN
2.0000 mg | Freq: Once | INTRAMUSCULAR | Status: AC
  Administered 2018-04-27: 2 mg via INTRAVENOUS
  Filled 2018-04-27: qty 1

## 2018-04-27 MED ORDER — METOCLOPRAMIDE HCL 10 MG PO TABS: 10.0000 mg | ORAL_TABLET | Freq: Four times a day (QID) | ORAL | 0 refills | Status: DC

## 2018-04-27 MED ORDER — ONDANSETRON HCL 4 MG/2ML IJ SOLN
4.0000 mg | Freq: Once | INTRAMUSCULAR | Status: AC
  Administered 2018-04-27: 4 mg via INTRAVENOUS
  Filled 2018-04-27: qty 2

## 2018-04-27 MED ORDER — MORPHINE SULFATE (PF) 4 MG/ML IV SOLN
4.0000 mg | Freq: Once | INTRAVENOUS | Status: DC
  Filled 2018-04-27: qty 1

## 2018-04-27 MED ORDER — PROMETHAZINE HCL 25 MG/ML IJ SOLN
25.0000 mg | Freq: Once | INTRAMUSCULAR | Status: DC
  Filled 2018-04-27: qty 1

## 2018-04-27 MED ORDER — SODIUM CHLORIDE 0.9 % IV BOLUS
1000.0000 mL | Freq: Once | INTRAVENOUS | Status: AC
  Administered 2018-04-27: 1000 mL via INTRAVENOUS

## 2018-04-27 MED ORDER — METOCLOPRAMIDE HCL 5 MG/ML IJ SOLN
10.0000 mg | Freq: Once | INTRAMUSCULAR | Status: AC
  Administered 2018-04-27: 10 mg via INTRAVENOUS
  Filled 2018-04-27: qty 2

## 2018-04-27 MED ORDER — IOPAMIDOL (ISOVUE-300) INJECTION 61%
100.0000 mL | Freq: Once | INTRAVENOUS | Status: AC | PRN
  Administered 2018-04-27: 100 mL via INTRAVENOUS

## 2018-04-27 NOTE — ED Triage Notes (Signed)
Per pt, states vomiting for over a week-states he has been to ED twice-last visit on Saturday-states his PCP told him to come here sue to some abnormal labs she saw from his ED visit-patient states he is still vomiting and having watery stools-cant keep anything down

## 2018-04-27 NOTE — ED Provider Notes (Signed)
[facility]-EMERGENCY DEPT Provider Note   CSN: [id number] Arrival date & time: 04/27/18  1101     History   Chief Complaint Chief Complaint  Patient presents with  . Emesis    HPI Bruce KitchensVictor Shelton is a 32 y.o. male.  HPI  32 y.o. male, presents to the Emergency Department today due to vomiting week. Patient states he was seen in the emergency department twice last week for similar symptoms. Patient had inguinal hernia repair on the right side on 04-20-18. Noted symptom onset in PACU. Went to emergency department the next day with unremarkable evaluation. Return again due to continued symptoms and antinausea meds changed. States that he spoke with on-call general surgery PA set to return to the ED due to abnormal lab work. Notes continued symptoms of nausea and vomiting her right lower quadrant abdominal pain since surgery. No fevers. No diarrhea. Normal bowel movements. No melena or hematochezia. No hematemesis. Rates pain 6 out of 10. Described as throbbing sensation. No other symptoms noted   Past Medical History:  Diagnosis Date  . Hemorrhoid   . Hypokalemia     Patient Active Problem List   Diagnosis Date Noted  . Intractable vomiting 12/16/2017  . Cannabis abuse 12/16/2017  . Dehydration 07/02/2015  . AKI (acute kidney injury) (HCC) 07/02/2015  . Hemorrhoid     Past Surgical History:  Procedure Laterality Date  . HEMORRHOID SURGERY N/A 07/03/2015   Procedure: Exam under anesthesia, HEMORRHOIDECTOMY;  Surgeon: Avel Peaceodd Rosenbower, MD;  Location: [facility];  Service: General;  Laterality: N/A;  . HERNIA REPAIR    . WISDOM TOOTH EXTRACTION          Home Medications    Prior to Admission medications   Medication Sig Start Date End Date Taking? Authorizing Provider  HYDROcodone-acetaminophen (NORCO/VICODIN) 5-325 MG tablet Take 1-2 tablets by mouth every 6 (six) hours as needed for moderate pain or severe pain.  04/20/18   [provider]    metoCLOPramide (REGLAN) 10 MG tablet Take 1 tablet (10 mg total) by mouth every 6 (six) hours as needed for nausea (nausea/headache). Patient not taking: Reported on 04/25/2018 04/21/18   Mesner, Barbara CowerJason, MD  pantoprazole (PROTONIX) 20 MG tablet Take 1 tablet (20 mg total) by mouth daily. 04/25/18   Elisha PonderMurray, Alyssa B, PA-C  promethazine (PHENERGAN) 12.5 MG tablet Take 12.5 mg by mouth every 6 (six) hours as needed for nausea/vomiting. 04/23/18   [provider]  promethazine (PHENERGAN) 25 MG suppository Place 1 suppository (25 mg total) rectally every 6 (six) hours as needed for nausea or vomiting. Place 1 suppository rectally every 6 hours.  Do not combine with oral Phenergan.  You may either take rectal or oral Phenergan every 6 hours, but  you may not combine the two modalities. 04/25/18   Aviva KluverMurray, Alyssa B, PA-C  sucralfate (CARAFATE) 1 GM/10ML suspension Take 10 mLs (1 g total) by mouth 4 (four) times daily -  with meals and at bedtime. 04/25/18   Elisha PonderMurray, Alyssa B, PA-C    Family History No family history on file.  Social History Social History   Tobacco Use  . Smoking status: Never Smoker  . Smokeless tobacco: Never Used  Substance Use Topics  . Alcohol use: No  . Drug use: Yes    Types: Marijuana    Comment: Smoked Yesterday     Allergies   Patient has no known allergies.   Review of Systems Review of Systems ROS reviewed and all are  negative for acute change except as noted in the HPI.  Physical Exam Updated Vital Signs There were no vitals taken for this visit.  Physical Exam  Constitutional: He is oriented to person, place, and time. He appears well-developed and well-nourished. No distress.  HENT:  Head: Normocephalic and atraumatic.  Right Ear: Tympanic membrane, external ear and ear canal normal.  Left Ear: Tympanic membrane, external ear and ear canal normal.  Nose: Nose normal.  Mouth/Throat: Uvula is midline, oropharynx is clear and moist and mucous  membranes are normal. No trismus in the jaw. No oropharyngeal exudate, posterior oropharyngeal erythema or tonsillar abscesses.  Eyes: Pupils are equal, round, and reactive to light. EOM are normal.  Neck: Normal range of motion. Neck supple. No tracheal deviation present.  Cardiovascular: Normal rate, regular rhythm, S1 normal, S2 normal, normal heart sounds, intact distal pulses and normal pulses.  Pulmonary/Chest: Effort normal and breath sounds normal. No respiratory distress. He has no decreased breath sounds. He has no wheezes. He has no rhonchi. He has no rales.  Abdominal: Normal appearance and bowel sounds are normal. There is tenderness in the right lower quadrant and epigastric area. There is no rigidity, no rebound, no guarding, no CVA tenderness, no tenderness at McBurney's point and negative Murphy's sign.  Abdomen soft. TTP right lower quadrant. Surgical incision noted without erythema. No palpable masses.  Musculoskeletal: Normal range of motion.  Neurological: He is alert and oriented to person, place, and time.  Skin: Skin is warm and dry.  Psychiatric: He has a normal mood and affect. His speech is normal and behavior is normal. Thought content normal.     ED Treatments / Results  Labs (all labs ordered are listed, but only abnormal results are displayed) Labs Reviewed  COMPREHENSIVE METABOLIC PANEL - Abnormal; Notable for the following components:      Result Value   Chloride 96 (*)    Glucose, Bld 100 (*)    AST 55 (*)    ALT 81 (*)    Total Bilirubin 1.5 (*)    All other components within normal limits  URINALYSIS, ROUTINE W REFLEX MICROSCOPIC - Abnormal; Notable for the following components:   Color, Urine AMBER (*)    Ketones, ur 5 (*)    All other components within normal limits  RAPID URINE DRUG SCREEN, HOSP PERFORMED - Abnormal; Notable for the following components:   Tetrahydrocannabinol POSITIVE (*)    Barbiturates   (*)    Value: Result not available.  Reagent lot number recalled by manufacturer.   All other components within normal limits  LIPASE, BLOOD  CBC    EKG None  Radiology Ct Abdomen Pelvis W Contrast  Result Date: 04/27/2018 CLINICAL DATA:  Initial evaluation for acute nausea, vomiting. EXAM: CT ABDOMEN AND PELVIS WITH CONTRAST TECHNIQUE: Multidetector CT imaging of the abdomen and pelvis was performed using the standard protocol following bolus administration of intravenous contrast. CONTRAST:  ISOVUE-300 IOPAMIDOL (ISOVUE-300) INJECTION 61% COMPARISON:  Prior CT from 12/14/2017. FINDINGS: Lower chest: Mild scattered subsegmental atelectatic changes present at the lung bases bilaterally. Small fat containing Bochdalek's type hernia is noted bilaterally, left larger than right. Visualized lung bases are otherwise clear. Hepatobiliary: Liver demonstrates a normal contrast enhanced appearance. Gallbladder within normal limits. No biliary dilatation. Pancreas: Pancreas within normal limits. Spleen: Spleen within normal limits. Adrenals/Urinary Tract: Adrenal glands are normal. Kidneys equal size with symmetric enhancement. No nephrolithiasis, hydronephrosis, or focal enhancing renal mass. No hydroureter. Bladder largely decompressed without  acute abnormality. Stomach/Bowel: Small hiatal hernia noted. Stomach within normal limits. Small right inguinal hernia containing a loop of small bowel seen without associated obstruction or inflammation (series 2, image 74). No acute inflammatory changes about the bowels. Appendix within normal limits. Vascular/Lymphatic: Normal intravascular enhancement seen throughout the intra-abdominal aorta and its branch vessels. No adenopathy. Reproductive: Prostate normal. Other: No free air or fluid. Musculoskeletal: No acute osseous abnormality. No worrisome lytic or blastic osseous lesions. IMPRESSION: 1. Small right inguinal hernia containing a short segment loop of bowel without associated obstruction or  inflammation. 2. No other acute intra-abdominal or pelvic process identified. Electronically Signed   By: Rise Mu M.D.   On: 04/27/2018 14:30    Procedures Procedures (including critical care time)  Medications Ordered in ED Medications  morphine 4 MG/ML injection 4 mg (4 mg Intravenous Refused 04/27/18 1322)  iopamidol (ISOVUE-300) 61 % injection (has no administration in time range)  haloperidol lactate (HALDOL) injection 2 mg (has no administration in time range)  metoCLOPramide (REGLAN) injection 10 mg (has no administration in time range)  sodium chloride 0.9 % bolus 1,000 mL (0 mLs Intravenous Stopped 04/27/18 1351)  ondansetron (ZOFRAN) injection 4 mg (4 mg Intravenous Given 04/27/18 1322)  iopamidol (ISOVUE-300) 61 % injection 100 mL (100 mLs Intravenous Contrast Given 04/27/18 1408)     Initial Impression / Assessment and Plan / ED Course  I have reviewed the triage vital signs and the nursing notes.  Pertinent labs & imaging results that were available during my care of the patient were reviewed by me and considered in my medical decision making (see chart for details).  Final Clinical Impressions(s) / ED Diagnoses  {I have reviewed and evaluated the relevant laboratory values. {I have reviewed and evaluated the relevant imaging studies.  {I have reviewed the relevant previous healthcare records.  {I obtained HPI from historian.   ED Course:  Assessment: Pt is a 32 y.o. male presents to the Emergency Department today due to vomiting week. Patient states he was seen in the emergency department twice last week for similar symptoms. Patient had inguinal hernia repair on the right side on 04-20-18. Noted symptom onset in PACU. Went to emergency department the next day with unremarkable evaluation. Return again due to continued symptoms and antinausea meds changed. States that he spoke with on-call general surgery PA set to return to the ED due to abnormal lab work. Notes  continued symptoms of nausea and vomiting her right lower quadrant abdominal pain since surgery. No fevers. No diarrhea. Normal bowel movements. No melena or hematochezia. No hematemesis. Rates pain 6 out of 10. Described as throbbing sensation. On exam, pt in NAD. Nontoxic/nonseptic appearing. VSS. Afebrile. Lungs CTA. Heart RRR. Abdomen TTP RLQ. Incision appears unremarkable. Seen by surgery PA. Will obtain labs and imaging with CT.   2:47 PM- CT abdomen/pelvis showed small segment of loop of bowel without obstruction or inflammation  2:53 PM- Spoke with surgery PA. Unlikely to cause symptoms due to no obstruction. Will confer with surgeon, but this is unlikely. States that images on CT are actually post op fluid collection and hernia is not actually out. UDS did test positive for cannabis. Suspect related to marijuana abuse. Given 2mg  Haldol and 10mg  Reglan. Will reeval. If unable to tolerate PO, will consider admission due to 3rd ED visit and inability to tolerate PO.  3:47 PM- Sign out to Bruce Hart, PA-C  Disposition/Plan:  Pending meds Pt acknowledges and agrees with plan  Supervising  Physician Lorre Nick, MD  Final diagnoses:  Intractable vomiting with nausea, unspecified vomiting type  Cannabinoid hyperemesis syndrome [facility])    ED Discharge Orders    None       Audry Pili, PA-C 04/27/18 1547    Lorre Nick, MD 04/29/18 1237

## 2018-04-27 NOTE — ED Provider Notes (Signed)
Pt signed out to me by previous provider. He is a 32 year old male with hx of cyclical vomiting. This is his 3rd ED visit in the past week. Symptoms seem to start after surgical repair of an inguinal hernia. CT scan was done which was unremarkable and was discussed with surgery. Labs are overall unremarkable. He did test positive for THC. He has been given Zofran, Reglan, Haldol. Will reassess.  4:44 PM He was able to tolerate PO. He feels ready to go home. Will give him rx for Reglan and have him f/u with GI.     Bethel BornGekas, Bambie Pizzolato Marie, PA-C 04/27/18 1645    Benjiman CorePickering, Nathan, MD 04/27/18 [phone]

## 2018-04-27 NOTE — Discharge Instructions (Signed)
Take nausea medicines as prescribed Follow up with GI

## 2018-08-23 ENCOUNTER — Emergency Department (HOSPITAL_COMMUNITY)
Admission: EM | Admit: 2018-08-23 | Discharge: 2018-08-23 | Disposition: A | Discharge status: Home / Self Care | Payer: Managed Care, Other (non HMO) | Attending: Emergency Medicine | Admitting: Emergency Medicine

## 2018-08-23 ENCOUNTER — Other Ambulatory Visit: Payer: Self-pay

## 2018-08-23 ENCOUNTER — Emergency Department (HOSPITAL_COMMUNITY): Payer: Managed Care, Other (non HMO)

## 2018-08-23 ENCOUNTER — Encounter (HOSPITAL_COMMUNITY): Payer: Self-pay | Admitting: Emergency Medicine

## 2018-08-23 DIAGNOSIS — K529 Noninfective gastroenteritis and colitis, unspecified: Secondary | ICD-10-CM | POA: Insufficient documentation

## 2018-08-23 DIAGNOSIS — F1210 Cannabis abuse, uncomplicated: Secondary | ICD-10-CM | POA: Insufficient documentation

## 2018-08-23 DIAGNOSIS — R1110 Vomiting, unspecified: Secondary | ICD-10-CM | POA: Diagnosis present

## 2018-08-23 LAB — URINALYSIS, ROUTINE W REFLEX MICROSCOPIC
BILIRUBIN URINE: NEGATIVE
GLUCOSE, UA: NEGATIVE mg/dL
Hgb urine dipstick: NEGATIVE
KETONES UR: 20 mg/dL — AB
Leukocytes, UA: NEGATIVE
Nitrite: NEGATIVE
Protein, ur: NEGATIVE mg/dL
SPECIFIC GRAVITY, URINE: 1.028 (ref 1.005–1.030)
pH: 7 (ref 5.0–8.0)

## 2018-08-23 LAB — CBC WITH DIFFERENTIAL/PLATELET
Abs Immature Granulocytes: 0.03 10*3/uL (ref 0.00–0.07)
BASOS ABS: 0 10*3/uL (ref 0.0–0.1)
Basophils Relative: 0 %
EOS PCT: 0 %
Eosinophils Absolute: 0 10*3/uL (ref 0.0–0.5)
HEMATOCRIT: 46.4 % (ref 39.0–52.0)
HEMOGLOBIN: 15.2 g/dL (ref 13.0–17.0)
Immature Granulocytes: 0 %
LYMPHS PCT: 9 %
Lymphs Abs: 0.8 10*3/uL (ref 0.7–4.0)
MCH: 30.1 pg (ref 26.0–34.0)
MCHC: 32.8 g/dL (ref 30.0–36.0)
MCV: 91.9 fL (ref 80.0–100.0)
MONO ABS: 0.7 10*3/uL (ref 0.1–1.0)
Monocytes Relative: 8 %
NRBC: 0.2 % (ref 0.0–0.2)
Neutro Abs: 7.6 10*3/uL (ref 1.7–7.7)
Neutrophils Relative %: 83 %
Platelets: 179 10*3/uL (ref 150–400)
RBC: 5.05 MIL/uL (ref 4.22–5.81)
RDW: 12.8 % (ref 11.5–15.5)
WBC: 9.2 10*3/uL (ref 4.0–10.5)

## 2018-08-23 LAB — COMPREHENSIVE METABOLIC PANEL
ALBUMIN: 4.5 g/dL (ref 3.5–5.0)
ALT: 25 U/L (ref 0–44)
AST: 43 U/L — AB (ref 15–41)
Alkaline Phosphatase: 68 U/L (ref 38–126)
Anion gap: 13 (ref 5–15)
BUN: 14 mg/dL (ref 6–20)
CHLORIDE: 106 mmol/L (ref 98–111)
CO2: 23 mmol/L (ref 22–32)
Calcium: 9.4 mg/dL (ref 8.9–10.3)
Creatinine, Ser: 0.98 mg/dL (ref 0.61–1.24)
GFR calc Af Amer: >60 mL/min (ref >60)
GLUCOSE: 123 mg/dL — AB (ref 70–99)
POTASSIUM: 4.6 mmol/L (ref 3.5–5.1)
Sodium: 142 mmol/L (ref 135–145)
Total Bilirubin: 1.3 mg/dL — ABNORMAL HIGH (ref 0.3–1.2)
Total Protein: 8 g/dL (ref 6.5–8.1)

## 2018-08-23 LAB — CBC
HEMATOCRIT: 46.5 % (ref 39.0–52.0)
HEMOGLOBIN: 15.1 g/dL (ref 13.0–17.0)
MCH: 29.6 pg (ref 26.0–34.0)
MCHC: 32.5 g/dL (ref 30.0–36.0)
MCV: 91.2 fL (ref 80.0–100.0)
Platelets: 178 10*3/uL (ref 150–400)
RBC: 5.1 MIL/uL (ref 4.22–5.81)
RDW: 12.5 % (ref 11.5–15.5)
WBC: 9.4 10*3/uL (ref 4.0–10.5)
nRBC: 0 % (ref 0.0–0.2)

## 2018-08-23 LAB — LIPASE, BLOOD: Lipase: 26 U/L (ref 11–51)

## 2018-08-23 MED ORDER — ONDANSETRON HCL 4 MG/2ML IJ SOLN
4.0000 mg | Freq: Once | INTRAMUSCULAR | Status: AC
  Administered 2018-08-23: 4 mg via INTRAVENOUS
  Filled 2018-08-23: qty 2

## 2018-08-23 MED ORDER — ONDANSETRON 4 MG PO TBDP: ORAL_TABLET | ORAL | 0 refills | Status: DC

## 2018-08-23 MED ORDER — FAMOTIDINE 20 MG PO TABS: 20.0000 mg | ORAL_TABLET | Freq: Two times a day (BID) | ORAL | 0 refills | Status: DC

## 2018-08-23 MED ORDER — ONDANSETRON 4 MG PO TBDP
4.0000 mg | ORAL_TABLET | Freq: Once | ORAL | Status: AC | PRN
  Administered 2018-08-23: 4 mg via ORAL
  Filled 2018-08-23: qty 1

## 2018-08-23 MED ORDER — KETOROLAC TROMETHAMINE 30 MG/ML IJ SOLN
30.0000 mg | Freq: Once | INTRAMUSCULAR | Status: AC
  Administered 2018-08-23: 30 mg via INTRAVENOUS
  Filled 2018-08-23: qty 1

## 2018-08-23 MED ORDER — SODIUM CHLORIDE 0.9 % IV BOLUS
2000.0000 mL | Freq: Once | INTRAVENOUS | Status: AC
  Administered 2018-08-23: 2000 mL via INTRAVENOUS

## 2018-08-23 NOTE — ED Triage Notes (Signed)
Pt reports n/v since last night. Denies diarrhea or pains, or urinary problems.

## 2018-08-23 NOTE — ED Provider Notes (Signed)
[facility]-EMERGENCY DEPT Provider Note   CSN: [id number] Arrival date & time: 08/23/18  1414     History   Chief Complaint Chief Complaint  Patient presents with  . Emesis    HPI Bruce Shelton is a 32 y.o. male.  Patient complains of vomiting with vomiting and lower abdominal pain.  The history is provided by the patient. No language interpreter was used.  Emesis   This is a new problem. The current episode started 12 to 24 hours ago. The problem occurs 2 to 4 times per day. The problem has not changed since onset.The emesis has an appearance of stomach contents. There has been no fever. Pertinent negatives include no abdominal pain, no chills, no cough, no diarrhea and no headaches. Risk factors: Unknown.    Past Medical History:  Diagnosis Date  . Hemorrhoid   . Hypokalemia     Patient Active Problem List   Diagnosis Date Noted  . Intractable vomiting 12/16/2017  . Cannabis abuse 12/16/2017  . Dehydration 07/02/2015  . AKI (acute kidney injury) (HCC) 07/02/2015  . Hemorrhoid     Past Surgical History:  Procedure Laterality Date  . HEMORRHOID SURGERY N/A 07/03/2015   Procedure: Exam under anesthesia, HEMORRHOIDECTOMY;  Surgeon: Avel Peace, MD;  Location: [facility];  Service: General;  Laterality: N/A;  . HERNIA REPAIR    . WISDOM TOOTH EXTRACTION          Home Medications    Prior to Admission medications   Medication Sig Start Date End Date Taking? Authorizing Provider  famotidine (PEPCID) 20 MG tablet Take 1 tablet (20 mg total) by mouth 2 (two) times daily. 08/23/18   Bethann Berkshire, MD  metoCLOPramide (REGLAN) 10 MG tablet Take 1 tablet (10 mg total) by mouth every 6 (six) hours. Patient not taking: Reported on 08/23/2018 04/27/18   Bethel Born, PA-C  ondansetron (ZOFRAN ODT) 4 MG disintegrating tablet 4mg  ODT q4 hours prn nausea/vomit 08/23/18   Bethann Berkshire, MD  pantoprazole (PROTONIX) 20 MG tablet Take 1 tablet (20 mg  total) by mouth daily. Patient not taking: Reported on 08/23/2018 04/25/18   Elisha Ponder, PA-C  promethazine (PHENERGAN) 25 MG suppository Place 1 suppository (25 mg total) rectally every 6 (six) hours as needed for nausea or vomiting. Place 1 suppository rectally every 6 hours.  Do not combine with oral Phenergan.  You may either take rectal or oral Phenergan every 6 hours, but  you may not combine the two modalities. Patient not taking: Reported on 08/23/2018 04/25/18   Aviva Kluver B, PA-C  sucralfate (CARAFATE) 1 GM/10ML suspension Take 10 mLs (1 g total) by mouth 4 (four) times daily -  with meals and at bedtime. Patient not taking: Reported on 08/23/2018 04/25/18   Elisha Ponder, PA-C    Family History No family history on file.  Social History Social History   Tobacco Use  . Smoking status: Never Smoker  . Smokeless tobacco: Never Used  Substance Use Topics  . Alcohol use: No  . Drug use: Yes    Types: Marijuana     Allergies   Patient has no known allergies.   Review of Systems Review of Systems  Constitutional: Negative for appetite change, chills and fatigue.  HENT: Negative for congestion, ear discharge and sinus pressure.   Eyes: Negative for discharge.  Respiratory: Negative for cough.   Cardiovascular: Negative for chest pain.  Gastrointestinal: Positive for vomiting. Negative for abdominal pain and diarrhea.  Genitourinary: Negative for frequency and hematuria.  Musculoskeletal: Negative for back pain.  Skin: Negative for rash.  Neurological: Negative for seizures and headaches.  Psychiatric/Behavioral: Negative for hallucinations.     Physical Exam Updated Vital Signs BP 124/73   Pulse 70   Temp 97.9 F (36.6 C)   Resp 18   SpO2 98%   Physical Exam  Constitutional: He is oriented to person, place, and time. He appears well-developed.  HENT:  Head: Normocephalic.  Eyes: Conjunctivae and EOM are normal. No scleral icterus.  Neck: Neck  supple. No thyromegaly present.  Cardiovascular: Normal rate and regular rhythm. Exam reveals no gallop and no friction rub.  No murmur heard. Pulmonary/Chest: No stridor. He has no wheezes. He has no rales. He exhibits no tenderness.  Abdominal: He exhibits no distension. There is tenderness. There is no rebound.  Minimal tenderness throughout  Musculoskeletal: Normal range of motion. He exhibits no edema.  Lymphadenopathy:    He has no cervical adenopathy.  Neurological: He is oriented to person, place, and time. He exhibits normal muscle tone. Coordination normal.  Skin: No rash noted. No erythema.  Psychiatric: He has a normal mood and affect. His behavior is normal.     ED Treatments / Results  Labs (all labs ordered are listed, but only abnormal results are displayed) Labs Reviewed  COMPREHENSIVE METABOLIC PANEL - Abnormal; Notable for the following components:      Result Value   Glucose, Bld 123 (*)    AST 43 (*)    Total Bilirubin 1.3 (*)    All other components within normal limits  URINALYSIS, ROUTINE W REFLEX MICROSCOPIC - Abnormal; Notable for the following components:   Ketones, ur 20 (*)    All other components within normal limits  LIPASE, BLOOD  CBC  CBC WITH DIFFERENTIAL/PLATELET    EKG None  Radiology Dg Abd Acute W/chest  Result Date: 08/23/2018 CLINICAL DATA:  Nausea and vomiting. EXAM: DG ABDOMEN ACUTE W/ 1V CHEST COMPARISON:  CT abdomen pelvis dated April 27, 2018. FINDINGS: The cardiomediastinal silhouette is normal in size. Normal pulmonary vascularity. No focal consolidation, pleural effusion, or pneumothorax. There is no evidence of dilated bowel loops or free intraperitoneal air. No radiopaque calculi or other significant radiographic abnormality is seen. No acute osseous abnormality. IMPRESSION: Negative abdominal radiographs.  No acute cardiopulmonary disease. Electronically Signed   By: Obie Dredge M.D.   On: 08/23/2018 17:33     Procedures Procedures (including critical care time)  Medications Ordered in ED Medications  ondansetron (ZOFRAN-ODT) disintegrating tablet 4 mg (4 mg Oral Given 08/23/18 1428)  sodium chloride 0.9 % bolus 2,000 mL (2,000 mLs Intravenous New Bag/Given 08/23/18 1544)  ondansetron (ZOFRAN) injection 4 mg (4 mg Intravenous Given 08/23/18 1544)  ketorolac (TORADOL) 30 MG/ML injection 30 mg (30 mg Intravenous Given 08/23/18 1544)     Initial Impression / Assessment and Plan / ED Course  I have reviewed the triage vital signs and the nursing notes.  Pertinent labs & imaging results that were available during my care of the patient were reviewed by me and considered in my medical decision making (see chart for details).     Labs unremarkable x-rays unremarkable.  Patient improved with fluids and nausea medicine.  Suspect viral gastroenteritis.  Patient will be sent home with Zofran and Pepcid and will follow-up not improving  Final Clinical Impressions(s) / ED Diagnoses   Final diagnoses:  Gastroenteritis    ED Discharge Orders  Ordered    ondansetron (ZOFRAN ODT) 4 MG disintegrating tablet     08/23/18 1919    famotidine (PEPCID) 20 MG tablet  2 times daily     08/23/18 1919           Bethann Berkshire, MD 08/23/18 1925

## 2018-08-23 NOTE — ED Notes (Signed)
Patient provided with urinal. Made aware urine sample is needed. 

## 2018-08-23 NOTE — ED Notes (Signed)
ED Provider at bedside. 

## 2018-08-23 NOTE — ED Notes (Signed)
Unable to get pt's vital signs due to pt's manic behavior and unable to hold still

## 2018-08-23 NOTE — ED Notes (Signed)
Bed: WA04 Expected date:  Expected time:  Means of arrival:  Comments: 

## 2018-08-23 NOTE — Discharge Instructions (Addendum)
Follow-up with a family doctor if not improving a couple days

## 2019-01-27 HISTORY — PX: WISDOM TOOTH EXTRACTION: SHX21

## 2019-02-07 ENCOUNTER — Encounter (HOSPITAL_COMMUNITY): Payer: Self-pay | Admitting: *Deleted

## 2019-02-07 ENCOUNTER — Emergency Department (HOSPITAL_COMMUNITY): Payer: BC Managed Care – PPO

## 2019-02-07 ENCOUNTER — Other Ambulatory Visit: Payer: Self-pay

## 2019-02-07 ENCOUNTER — Emergency Department (HOSPITAL_COMMUNITY)
Admission: EM | Admit: 2019-02-07 | Discharge: 2019-02-08 | Disposition: A | Discharge status: Home / Self Care | Payer: BC Managed Care – PPO | Attending: Emergency Medicine | Admitting: Emergency Medicine

## 2019-02-07 DIAGNOSIS — Z79899 Other long term (current) drug therapy: Secondary | ICD-10-CM | POA: Insufficient documentation

## 2019-02-07 DIAGNOSIS — R109 Unspecified abdominal pain: Secondary | ICD-10-CM | POA: Insufficient documentation

## 2019-02-07 DIAGNOSIS — R112 Nausea with vomiting, unspecified: Secondary | ICD-10-CM | POA: Diagnosis not present

## 2019-02-07 LAB — URINALYSIS, ROUTINE W REFLEX MICROSCOPIC
BACTERIA UA: NONE SEEN
Bilirubin Urine: NEGATIVE
Glucose, UA: NEGATIVE mg/dL
Ketones, ur: 20 mg/dL — AB
Leukocytes,Ua: NEGATIVE
Nitrite: NEGATIVE
Protein, ur: 30 mg/dL — AB
Specific Gravity, Urine: 1.03 (ref 1.005–1.030)
pH: 8 (ref 5.0–8.0)

## 2019-02-07 LAB — COMPREHENSIVE METABOLIC PANEL
ALT: 42 U/L (ref 0–44)
AST: 41 U/L (ref 15–41)
Albumin: 5 g/dL (ref 3.5–5.0)
Alkaline Phosphatase: 73 U/L (ref 38–126)
Anion gap: 16 — ABNORMAL HIGH (ref 5–15)
BUN: 24 mg/dL — ABNORMAL HIGH (ref 6–20)
CO2: 25 mmol/L (ref 22–32)
Calcium: 10.1 mg/dL (ref 8.9–10.3)
Chloride: 100 mmol/L (ref 98–111)
Creatinine, Ser: 1.2 mg/dL (ref 0.61–1.24)
GFR calc Af Amer: >60 mL/min (ref >60)
Glucose, Bld: 139 mg/dL — ABNORMAL HIGH (ref 70–99)
Potassium: 3.5 mmol/L (ref 3.5–5.1)
Sodium: 141 mmol/L (ref 135–145)
Total Bilirubin: 0.8 mg/dL (ref 0.3–1.2)
Total Protein: 9.2 g/dL — ABNORMAL HIGH (ref 6.5–8.1)

## 2019-02-07 LAB — CBC
HCT: 47.4 % (ref 39.0–52.0)
Hemoglobin: 15.8 g/dL (ref 13.0–17.0)
MCH: 29.9 pg (ref 26.0–34.0)
MCHC: 33.3 g/dL (ref 30.0–36.0)
MCV: 89.8 fL (ref 80.0–100.0)
Platelets: 234 10*3/uL (ref 150–400)
RBC: 5.28 MIL/uL (ref 4.22–5.81)
RDW: 12.9 % (ref 11.5–15.5)
WBC: 12 10*3/uL — ABNORMAL HIGH (ref 4.0–10.5)
nRBC: 0 % (ref 0.0–0.2)

## 2019-02-07 LAB — LIPASE, BLOOD: Lipase: 20 U/L (ref 11–51)

## 2019-02-07 MED ORDER — SODIUM CHLORIDE 0.9 % IV BOLUS
1000.0000 mL | Freq: Once | INTRAVENOUS | Status: AC
  Administered 2019-02-07: 1000 mL via INTRAVENOUS

## 2019-02-07 MED ORDER — SODIUM CHLORIDE (PF) 0.9 % IJ SOLN
INTRAMUSCULAR | Status: AC
  Filled 2019-02-07: qty 50

## 2019-02-07 MED ORDER — PROMETHAZINE HCL 25 MG/ML IJ SOLN
25.0000 mg | Freq: Once | INTRAMUSCULAR | Status: AC
  Administered 2019-02-07: 25 mg via INTRAVENOUS
  Filled 2019-02-07: qty 1

## 2019-02-07 MED ORDER — SODIUM CHLORIDE 0.9% FLUSH: 3.0000 mL | Freq: Once | INTRAVENOUS | Status: DC

## 2019-02-07 MED ORDER — IOHEXOL 300 MG/ML  SOLN
100.0000 mL | Freq: Once | INTRAMUSCULAR | Status: AC | PRN
  Administered 2019-02-07: 100 mL via INTRAVENOUS

## 2019-02-07 MED ORDER — LORAZEPAM 2 MG/ML IJ SOLN
1.0000 mg | Freq: Once | INTRAMUSCULAR | Status: AC
  Administered 2019-02-07: 1 mg via INTRAVENOUS
  Filled 2019-02-07: qty 1

## 2019-02-07 MED ORDER — PROMETHAZINE HCL 25 MG PO TABS: 25.0000 mg | ORAL_TABLET | Freq: Four times a day (QID) | ORAL | 0 refills | Status: DC | PRN

## 2019-02-07 MED ORDER — PROMETHAZINE HCL 25 MG RE SUPP: 25.0000 mg | Freq: Four times a day (QID) | RECTAL | 0 refills | Status: DC | PRN

## 2019-02-07 NOTE — Discharge Instructions (Signed)
Return here as needed. Follow up with a primary doctor. °

## 2019-02-07 NOTE — ED Notes (Signed)
Pt given ginger ale to provide po fluid challenge.

## 2019-02-07 NOTE — ED Triage Notes (Signed)
Started vomiting early Sat morning, went to UC yesterday morning, stated bp is too high, given meds but can not keep them down. Multiple episodes of vomiting

## 2019-02-07 NOTE — ED Notes (Signed)
Pt reporting improvement in nausea at this time. No acute distress awaiting CT scan.

## 2019-02-07 NOTE — ED Provider Notes (Signed)
[facility]-EMERGENCY DEPT Provider Note   CSN: [id number] Arrival date & time: 02/07/19  1845    History   Chief Complaint Chief Complaint  Patient presents with  . Emesis  . Hyperventilating    HPI Bruce Shelton is a 33 y.o. male.     HPI Patient presents to the emergency department with nausea vomiting and abdominal discomfort.  Patient states that he was seen in urgent care yesterday given Zofran.  The patient states that did not seem to help.  Patient states his mouth is very dry.  The patient denies chest pain, shortness of breath, headache,blurred vision, neck pain, fever, cough, weakness, numbness, dizziness, anorexia, edema,diarrhea, rash, back pain, dysuria, hematemesis, bloody stool, near syncope, or syncope. Past Medical History:  Diagnosis Date  . Hemorrhoid   . Hypokalemia     Patient Active Problem List   Diagnosis Date Noted  . Intractable vomiting 12/16/2017  . Cannabis abuse 12/16/2017  . Dehydration 07/02/2015  . AKI (acute kidney injury) (HCC) 07/02/2015  . Hemorrhoid     Past Surgical History:  Procedure Laterality Date  . HEMORRHOID SURGERY N/A 07/03/2015   Procedure: Exam under anesthesia, HEMORRHOIDECTOMY;  Surgeon: Avel Peace, MD;  Location: [facility];  Service: General;  Laterality: N/A;  . HERNIA REPAIR    . WISDOM TOOTH EXTRACTION          Home Medications    Prior to Admission medications   Medication Sig Start Date End Date Taking? Authorizing Provider  famotidine (PEPCID) 20 MG tablet Take 1 tablet (20 mg total) by mouth 2 (two) times daily. 08/23/18   Bethann Berkshire, MD  metoCLOPramide (REGLAN) 10 MG tablet Take 1 tablet (10 mg total) by mouth every 6 (six) hours. Patient not taking: Reported on 08/23/2018 04/27/18   Bethel Born, PA-C  ondansetron (ZOFRAN ODT) 4 MG disintegrating tablet 4mg  ODT q4 hours prn nausea/vomit 08/23/18   Bethann Berkshire, MD  pantoprazole (PROTONIX) 20 MG tablet Take 1 tablet  (20 mg total) by mouth daily. Patient not taking: Reported on 08/23/2018 04/25/18   Elisha Ponder, PA-C  promethazine (PHENERGAN) 25 MG suppository Place 1 suppository (25 mg total) rectally every 6 (six) hours as needed for nausea or vomiting. Place 1 suppository rectally every 6 hours.  Do not combine with oral Phenergan.  You may either take rectal or oral Phenergan every 6 hours, but  you may not combine the two modalities. Patient not taking: Reported on 08/23/2018 04/25/18   Aviva Kluver B, PA-C  sucralfate (CARAFATE) 1 GM/10ML suspension Take 10 mLs (1 g total) by mouth 4 (four) times daily -  with meals and at bedtime. Patient not taking: Reported on 08/23/2018 04/25/18   Elisha Ponder, PA-C    Family History No family history on file.  Social History Social History   Tobacco Use  . Smoking status: Never Smoker  . Smokeless tobacco: Never Used  Substance Use Topics  . Alcohol use: No  . Drug use: Yes    Types: Marijuana     Allergies   Patient has no known allergies.   Review of Systems Review of Systems All other systems negative except as documented in the HPI. All pertinent positives and negatives as reviewed in the HPI.  Physical Exam Updated Vital Signs BP 123/80 (BP Location: Right Arm)   Pulse 69   Temp 98 F (36.7 C) (Oral)   Resp 17   Ht 6\' 1"  (1.854 m)   Wt 83.5  kg   SpO2 100%   BMI 24.28 kg/m   Physical Exam Vitals signs and nursing note reviewed.  Constitutional:      General: He is not in acute distress.    Appearance: He is well-developed.  HENT:     Head: Normocephalic and atraumatic.  Eyes:     Pupils: Pupils are equal, round, and reactive to light.  Neck:     Musculoskeletal: Normal range of motion and neck supple.  Cardiovascular:     Rate and Rhythm: Normal rate and regular rhythm.     Heart sounds: Normal heart sounds. No murmur. No friction rub. No gallop.   Pulmonary:     Effort: Pulmonary effort is normal. No respiratory  distress.     Breath sounds: Normal breath sounds. No wheezing.  Abdominal:     General: Bowel sounds are normal. There is no distension.     Palpations: Abdomen is soft.     Tenderness: There is abdominal tenderness. There is no guarding or rebound.  Skin:    General: Skin is warm and dry.     Capillary Refill: Capillary refill takes less than 2 seconds.     Findings: No erythema or rash.  Neurological:     Mental Status: He is alert and oriented to person, place, and time.     Motor: No abnormal muscle tone.     Coordination: Coordination normal.  Psychiatric:        Behavior: Behavior normal.      ED Treatments / Results  Labs (all labs ordered are listed, but only abnormal results are displayed) Labs Reviewed  COMPREHENSIVE METABOLIC PANEL - Abnormal; Notable for the following components:      Result Value   Glucose, Bld 139 (*)    BUN 24 (*)    Total Protein 9.2 (*)    Anion gap 16 (*)    All other components within normal limits  CBC - Abnormal; Notable for the following components:   WBC 12.0 (*)    All other components within normal limits  URINALYSIS, ROUTINE W REFLEX MICROSCOPIC - Abnormal; Notable for the following components:   Hgb urine dipstick SMALL (*)    Ketones, ur 20 (*)    Protein, ur 30 (*)    All other components within normal limits  LIPASE, BLOOD    EKG None  Radiology Ct Abdomen Pelvis W Contrast  Result Date: 02/07/2019 CLINICAL DATA:  Initial evaluation for acute generalized abdominal pain. EXAM: CT ABDOMEN AND PELVIS WITH CONTRAST TECHNIQUE: Multidetector CT imaging of the abdomen and pelvis was performed using the standard protocol following bolus administration of intravenous contrast. CONTRAST:  OMNIPAQUE IOHEXOL 300 MG/ML  SOLN COMPARISON:  Prior radiograph from 08/23/2018. FINDINGS: Lower chest: She mild subsegmental atelectatic changes at the right lung base. Fat containing Bochdalek's hernia at the medial left lung base.  Hepatobiliary: Liver demonstrates a normal contrast enhanced appearance. Gallbladder within normal limits. No biliary dilatation. Pancreas: Pancreas within normal limits. Spleen: Spleen within normal limits. Adrenals/Urinary Tract: Adrenal glands within normal limits. Kidneys equal in size with symmetric enhancement. No nephrolithiasis, hydronephrosis or focal enhancing renal mass. Subcentimeter hypodensity within the upper pole left kidney too small the characterize, but statistically likely reflects small cyst. No hydroureter. Partially distended bladder within normal limits. Stomach/Bowel: Small hiatal hernia noted. Stomach otherwise unremarkable. No evidence for bowel obstruction. Normal appendix. Colon is largely decompressed. Mild wall thickening with subtle hazy stranding about the distal descending colon at the left  lower quadrant, could reflect mild acute colitis (series 2, image 55). No other acute inflammatory changes seen about the bowels. Vascular/Lymphatic: Normal intravascular enhancement seen throughout the intra-abdominal aorta. Mesenteric vessels patent proximally. No adenopathy. Reproductive: Prostate normal. Other: No free air or fluid. Musculoskeletal: No acute osseous finding. No discrete lytic or blastic osseous lesions. IMPRESSION: 1. Mild hazy stranding about the distal descending colon, which could reflect the sequelae early/mild acute colitis. No complication. 2. No other acute intra-abdominal or pelvic process. 3. Small hiatal hernia. Electronically Signed   By: Rise Mu M.D.   On: 02/07/2019 21:24    Procedures Procedures (including critical care time)  Medications Ordered in ED Medications  sodium chloride (PF) 0.9 % injection (has no administration in time range)  sodium chloride 0.9 % bolus 1,000 mL (0 mLs Intravenous Stopped 02/07/19 2016)  promethazine (PHENERGAN) injection 25 mg (25 mg Intravenous Given 02/07/19 1933)  sodium chloride 0.9 % bolus 1,000 mL (0  mLs Intravenous Stopped 02/07/19 2107)  iohexol (OMNIPAQUE) 300 MG/ML solution 100 mL (100 mLs Intravenous Contrast Given 02/07/19 2051)     Initial Impression / Assessment and Plan / ED Course  I have reviewed the triage vital signs and the nursing notes.  Pertinent labs & imaging results that were available during my care of the patient were reviewed by me and considered in my medical decision making (see chart for details).        Patient is tolerated oral fluids and has maintained good vital signs here in the emergency department.  He is feeling better at this time.  He be discharged home.  Told to return here as needed.  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 Final Clinical Impressions(s) / ED Diagnoses   Final diagnoses:  None    ED Discharge Orders    None       Kyra Manges 02/07/19 2308    Lorre Nick, MD 02/09/19 1407

## 2019-02-07 NOTE — ED Notes (Signed)
Pt had appx 100 ml of emesis in emesis bag prior to being discharged and was then given Phenergan and Ativan.

## 2019-02-08 ENCOUNTER — Encounter (HOSPITAL_COMMUNITY): Payer: Self-pay | Admitting: Emergency Medicine

## 2019-02-08 ENCOUNTER — Emergency Department (HOSPITAL_COMMUNITY)
Admission: EM | Admit: 2019-02-08 | Discharge: 2019-02-08 | Disposition: A | Discharge status: Home / Self Care | Payer: BC Managed Care – PPO | Source: Home / Self Care | Attending: Emergency Medicine | Admitting: Emergency Medicine

## 2019-02-08 ENCOUNTER — Other Ambulatory Visit: Payer: Self-pay

## 2019-02-08 DIAGNOSIS — Z79899 Other long term (current) drug therapy: Secondary | ICD-10-CM

## 2019-02-08 DIAGNOSIS — R112 Nausea with vomiting, unspecified: Secondary | ICD-10-CM | POA: Insufficient documentation

## 2019-02-08 MED ORDER — LACTATED RINGERS IV BOLUS
1000.0000 mL | Freq: Once | INTRAVENOUS | Status: AC
  Administered 2019-02-08: 1000 mL via INTRAVENOUS

## 2019-02-08 MED ORDER — HALOPERIDOL LACTATE 5 MG/ML IJ SOLN
2.5000 mg | Freq: Once | INTRAMUSCULAR | Status: AC
  Administered 2019-02-08: 2.5 mg via INTRAVENOUS
  Filled 2019-02-08: qty 1

## 2019-02-08 MED ORDER — PROCHLORPERAZINE MALEATE 10 MG PO TABS: 10.0000 mg | ORAL_TABLET | Freq: Two times a day (BID) | ORAL | 0 refills | Status: DC | PRN

## 2019-02-08 MED ORDER — ONDANSETRON 4 MG PO TBDP: ORAL_TABLET | ORAL | 0 refills | Status: DC

## 2019-02-08 NOTE — ED Notes (Signed)
Pt stated he had a graham cracker and did not have any emesis afterwards. Roseanna Rainbow, RN aware.

## 2019-02-08 NOTE — ED Notes (Signed)
Pt given apple juice and IV removed. NAD

## 2019-02-08 NOTE — ED Notes (Signed)
Pt given and verbalized understanding of d/c instructions and need for follow up with pcp. Told to return if s/s worsen. No further distress or questions at this time 

## 2019-02-08 NOTE — ED Notes (Signed)
Apple juice and graham crackers given for PO trial.

## 2019-02-08 NOTE — ED Triage Notes (Signed)
Pt reports still having abd pains with n/v. Was seen here yesterday and given a prescription but states that took it to 3 pharmacies that didn't have it in stock to fill.

## 2019-02-08 NOTE — ED Provider Notes (Signed)
[facility]-EMERGENCY DEPT Provider Note   CSN: [id number] Arrival date & time: 02/08/19  1550    History   Chief Complaint Chief Complaint  Patient presents with  . Emesis  . Abdominal Pain    HPI Bruce Shelton is a 33 y.o. male.     HPI Patient presents with nausea vomiting.  Dull abdominal pain.  Seen here yesterday.  States he continues to vomit.  States been in for around 3 days.  Has had a history of throwing up episodes like this in the past.  States he is no longer smokes marijuana.  States he was given a prescription for Phenergan but was unable to get it filled because none of the 3 pharmacies he went to had.  No blood in the emesis.  No diarrhea.  Pain is in his upper abdomen.  States he is unsure what triggers the episode.  States he had last episode after a hernia repair.  Abdomen not swollen.  No fevers.  No sick contacts but states his girlfriend was feeling nauseous. Past Medical History:  Diagnosis Date  . Hemorrhoid   . Hypokalemia     Patient Active Problem List   Diagnosis Date Noted  . Intractable vomiting 12/16/2017  . Cannabis abuse 12/16/2017  . Dehydration 07/02/2015  . AKI (acute kidney injury) (HCC) 07/02/2015  . Hemorrhoid     Past Surgical History:  Procedure Laterality Date  . HEMORRHOID SURGERY N/A 07/03/2015   Procedure: Exam under anesthesia, HEMORRHOIDECTOMY;  Surgeon: Avel Peace, MD;  Location: [facility];  Service: General;  Laterality: N/A;  . HERNIA REPAIR    . WISDOM TOOTH EXTRACTION          Home Medications    Prior to Admission medications   Medication Sig Start Date End Date Taking? Authorizing Provider  famotidine (PEPCID) 20 MG tablet Take 1 tablet (20 mg total) by mouth 2 (two) times daily. Patient not taking: Reported on 02/08/2019 08/23/18   Bethann Berkshire, MD  metoCLOPramide (REGLAN) 10 MG tablet Take 1 tablet (10 mg total) by mouth every 6 (six) hours. Patient not taking: Reported on 02/08/2019  04/27/18   Bethel Born, PA-C  ondansetron [facility] ODT) 4 MG disintegrating tablet  ODT q4 hours prn nausea/vomit 02/08/19   Benjiman Core, MD  pantoprazole (PROTONIX) 20 MG tablet Take 1 tablet (20 mg total) by mouth daily. Patient not taking: Reported on 08/23/2018 04/25/18   Aviva Kluver B, PA-C  prochlorperazine (COMPAZINE) 10 MG tablet Take 1 tablet (10 mg total) by mouth 2 (two) times daily as needed for nausea or vomiting. 02/08/19   Benjiman Core, MD  promethazine (PHENERGAN) 25 MG suppository Place 1 suppository (25 mg total) rectally every 6 (six) hours as needed for nausea or vomiting. Patient not taking: Reported on 02/08/2019 02/07/19   Charlestine Night, PA-C  promethazine (PHENERGAN) 25 MG tablet Take 1 tablet (25 mg total) by mouth every 6 (six) hours as needed for nausea or vomiting. Patient not taking: Reported on 02/08/2019 02/07/19   Charlestine Night, PA-C  sucralfate (CARAFATE) 1 GM/10ML suspension Take 10 mLs (1 g total) by mouth 4 (four) times daily -  with meals and at bedtime. Patient not taking: Reported on 02/08/2019 04/25/18   Elisha Ponder, PA-C    Family History No family history on file.  Social History Social History   Tobacco Use  . Smoking status: Never Smoker  . Smokeless tobacco: Never Used  Substance Use Topics  . Alcohol  use: No  . Drug use: Yes    Types: Marijuana     Allergies   Patient has no known allergies.   Review of Systems Review of Systems  Constitutional: Positive for appetite change. Negative for diaphoresis and fever.  HENT: Negative for congestion.   Respiratory: Negative for shortness of breath.   Cardiovascular: Negative for chest pain.  Gastrointestinal: Positive for abdominal pain, nausea and vomiting.  Genitourinary: Negative for flank pain.  Musculoskeletal: Negative for back pain.  Skin: Negative for rash.  Neurological: Negative for weakness.  Psychiatric/Behavioral: Negative for confusion.      Physical Exam Updated Vital Signs BP 107/76 (BP Location: Right Arm)   Pulse 60   Temp 98.9 F (37.2 C) (Oral)   Resp 17   SpO2 100%   Physical Exam Vitals signs and nursing note reviewed.  HENT:     Mouth/Throat:     Mouth: Mucous membranes are moist.  Cardiovascular:     Rate and Rhythm: Normal rate and regular rhythm.  Pulmonary:     Effort: No respiratory distress.  Abdominal:     Tenderness: There is no abdominal tenderness.     Hernia: No hernia is present.  Skin:    General: Skin is warm.     Capillary Refill: Capillary refill takes less than 2 seconds.  Neurological:     General: No focal deficit present.     Mental Status: He is alert.  Psychiatric:        Mood and Affect: Mood normal.      ED Treatments / Results  Labs (all labs ordered are listed, but only abnormal results are displayed) Labs Reviewed - No data to display  EKG None  Radiology Ct Abdomen Pelvis W Contrast  Result Date: 02/07/2019 CLINICAL DATA:  Initial evaluation for acute generalized abdominal pain. EXAM: CT ABDOMEN AND PELVIS WITH CONTRAST TECHNIQUE: Multidetector CT imaging of the abdomen and pelvis was performed using the standard protocol following bolus administration of intravenous contrast. CONTRAST:  OMNIPAQUE IOHEXOL 300 MG/ML  SOLN COMPARISON:  Prior radiograph from 08/23/2018. FINDINGS: Lower chest: She mild subsegmental atelectatic changes at the right lung base. Fat containing Bochdalek's hernia at the medial left lung base. Hepatobiliary: Liver demonstrates a normal contrast enhanced appearance. Gallbladder within normal limits. No biliary dilatation. Pancreas: Pancreas within normal limits. Spleen: Spleen within normal limits. Adrenals/Urinary Tract: Adrenal glands within normal limits. Kidneys equal in size with symmetric enhancement. No nephrolithiasis, hydronephrosis or focal enhancing renal mass. Subcentimeter hypodensity within the upper pole left kidney too small the  characterize, but statistically likely reflects small cyst. No hydroureter. Partially distended bladder within normal limits. Stomach/Bowel: Small hiatal hernia noted. Stomach otherwise unremarkable. No evidence for bowel obstruction. Normal appendix. Colon is largely decompressed. Mild wall thickening with subtle hazy stranding about the distal descending colon at the left lower quadrant, could reflect mild acute colitis (series 2, image 55). No other acute inflammatory changes seen about the bowels. Vascular/Lymphatic: Normal intravascular enhancement seen throughout the intra-abdominal aorta. Mesenteric vessels patent proximally. No adenopathy. Reproductive: Prostate normal. Other: No free air or fluid. Musculoskeletal: No acute osseous finding. No discrete lytic or blastic osseous lesions. IMPRESSION: 1. Mild hazy stranding about the distal descending colon, which could reflect the sequelae early/mild acute colitis. No complication. 2. No other acute intra-abdominal or pelvic process. 3. Small hiatal hernia. Electronically Signed   By: Rise Mu M.D.   On: 02/07/2019 21:24    Procedures Procedures (including critical care time)  Medications Ordered in ED Medications  lactated ringers bolus 1,000 mL (0 mLs Intravenous Stopped 02/08/19 1840)  haloperidol lactate (HALDOL) injection 2.5 mg (2.5 mg Intravenous Given 02/08/19 1653)     Initial Impression / Assessment and Plan / ED Course  I have reviewed the triage vital signs and the nursing notes.  Pertinent labs & imaging results that were available during my care of the patient were reviewed by me and considered in my medical decision making (see chart for details).        Patient with continued nausea and vomiting.  Unable to get his Phenergan filled and states he is out of Zofran.  Had previous episode of same.  CT scan done yesterday and showed potential colitis, however there is no diarrhea and there is no tenderness to palpation  now.  Tolerated orals and will discharge home.  Prescription given for Compazine since he has not been able to find Phenergan  Final Clinical Impressions(s) / ED Diagnoses   Final diagnoses:  Non-intractable vomiting with nausea, unspecified vomiting type    ED Discharge Orders         Ordered    prochlorperazine (COMPAZINE) 10 MG tablet  2 times daily PRN     02/08/19 1943    ondansetron (ZOFRAN ODT) 4 MG disintegrating tablet     02/08/19 1944           Benjiman Core, MD 02/08/19 1952

## 2019-02-08 NOTE — ED Notes (Signed)
Pt given and verbalized understanding of d/c instructions and need for follow up with pcp and gastroenterology. Told to return if s/s worsen. No further distress or questions at this time

## 2019-02-09 ENCOUNTER — Telehealth: Payer: Self-pay | Admitting: Gastroenterology

## 2019-02-09 ENCOUNTER — Encounter: Payer: Self-pay | Admitting: Gastroenterology

## 2019-02-09 ENCOUNTER — Telehealth (INDEPENDENT_AMBULATORY_CARE_PROVIDER_SITE_OTHER): Payer: Managed Care, Other (non HMO) | Admitting: Gastroenterology

## 2019-02-09 DIAGNOSIS — R933 Abnormal findings on diagnostic imaging of other parts of digestive tract: Secondary | ICD-10-CM

## 2019-02-09 DIAGNOSIS — N179 Acute kidney failure, unspecified: Secondary | ICD-10-CM

## 2019-02-09 DIAGNOSIS — R112 Nausea with vomiting, unspecified: Secondary | ICD-10-CM | POA: Diagnosis not present

## 2019-02-09 DIAGNOSIS — F1210 Cannabis abuse, uncomplicated: Secondary | ICD-10-CM

## 2019-02-09 DIAGNOSIS — K920 Hematemesis: Secondary | ICD-10-CM

## 2019-02-09 DIAGNOSIS — D72829 Elevated white blood cell count, unspecified: Secondary | ICD-10-CM

## 2019-02-09 MED ORDER — METOCLOPRAMIDE HCL 10 MG PO TABS: 10.0000 mg | ORAL_TABLET | Freq: Four times a day (QID) | ORAL | 3 refills | Status: DC | PRN

## 2019-02-09 NOTE — Telephone Encounter (Signed)
I am able to see this patient as a virtual office appointment either today or tomorrow.  Please set up at his earliest convenience to review his ER evaluation and symptoms.  Thank you.

## 2019-02-09 NOTE — Telephone Encounter (Signed)
Spoke with patient scheduled for virtual visit today 02/09/2019 at 1:30pm with Dr.Cirigliano via Zoom meeting.

## 2019-02-09 NOTE — Progress Notes (Signed)
Chief Complaint: Nausea/vomiting, ER follow-up  Referring Provider:     Benjiman Core, MD [facility])   HPI:    Due to current restrictions/limitations of in-office visits due to the COVID-19 pandemic, this scheduled clinical appointment was converted to a telehealth virtual consultation using WebEx/Zoom.  -Time of medical discussion: 19 minutes -The patient did consent to this virtual visit and is aware of possible charges through their insurance for this visit.  -Names of all parties present: Bruce Shelton (patient), Doristine Locks, DO, [facility] (physician) -Patient location: Home -Physician location: Office  Bruce Shelton is a 33 y.o. male referred to the Gastroenterology Clinic for evaluation of nausea and vomiting.  He has been seen in the emergency department multiple times over the last year, including 12/14/2017, 12/15/2017, 12/16/2017, 04/21/2018, 04/25/2018, 04/27/2018, 08/23/2018, 08/25/2018, 02/07/2019 (UC), 02/08/2019 (yesterday) for nausea/vomiting.  Similarly, evaluated for nausea/vomiting multiple times in ER in 2017.  States that he has had intractable n/v for 3 days, with acute onset. Does report BRB streaked with emesis this AM. Has seen this in the past. Currently taking Compazine and Zofran for nausea, prescribed by UC. Historically, sxs resolve, then recur a few months later. Upper abdominal pain developed since starting N/V. Prior to sxs onset was in usual state of health and stable weight. Quit smoking THC 1 month ago. Was smoking 2-3 times/day. Historically, nausea improves with hot showers.   No diarrhea, hematochezia, or melena. No fever. No recent travel.  No sick contacts.  ER evaluation this week notable for CT desmonstrated small HH and mild wall thickening in the distal descending colon in the LLQ. UA with ketones and protein but no pyuria, normal lipase, BUN/creatinine 24/1.2 with otherwise normal CMP, WBC 12 with otherwise normal CBC.  Last UDS was 04/2018 and  positive for THC (also THC+ in 12/2017 and 2017). Previous intermittent hypoK+ at prior ER evaluations. Normal CT in 2 an d6 2019.   Past medical history, past surgical history, social history, family history, medications, and allergies reviewed in the chart and with patient over the WebEx.  Past Medical History:  Diagnosis Date  . Hemorrhoid   . Hypokalemia      Past Surgical History:  Procedure Laterality Date  . HEMORRHOID SURGERY N/A 07/03/2015   Procedure: Exam under anesthesia, HEMORRHOIDECTOMY;  Surgeon: Avel Peace, MD;  Location: [facility];  Service: General;  Laterality: N/A;  . HERNIA REPAIR    . WISDOM TOOTH EXTRACTION     No family history on file. Social History   Tobacco Use  . Smoking status: Never Smoker  . Smokeless tobacco: Never Used  Substance Use Topics  . Alcohol use: No  . Drug use: Yes    Types: Marijuana   Current Outpatient Medications  Medication Sig Dispense Refill  . ondansetron (ZOFRAN ODT) 4 MG disintegrating tablet 4mg  ODT q4 hours prn nausea/vomit 12 tablet 0  . prochlorperazine (COMPAZINE) 10 MG tablet Take 1 tablet (10 mg total) by mouth 2 (two) times daily as needed for nausea or vomiting. 10 tablet 0   No current facility-administered medications for this visit.    No Known Allergies   Review of Systems: All systems reviewed and negative except where noted in HPI.     Physical Exam:    Physical exam not completed due to the nature of this telehealth communication.  Patient was otherwise alert and oriented and well communicative.   ASSESSMENT AND PLAN;  1) Nausea/vomiting: Clinical history seems most consistent with cannabinoid hyperemesis syndrome.  Has been seen in the ER/urgent care multiple times over the last year, with relatively unremarkable evaluations previously, treated with antiemetics with cyclic recurrence of symptoms.  Has actually quit smoking Harwani 1 month ago.  Latest recurrence of symptoms actually may be  more consistent with viral GE as he also has a mild leukocytosis and mild left-sided colonic wall thickening, although no lower GI symptoms.  Discussed DDX and will evaluate and treat as below:  - Resume antiemetics as currently prescribed -Add Reglan 10 mg p.o. as needed every 6 hours.  Cautioned on ADR profile, to include tardive dyskinesia, and not to take them for more than 8 weeks.  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  Counseled on complete cessation/abstinence and hyperemesis cannabinoid syndrome - If no improvement, can consider EGD to evaluate for gastritis, PUD, GOO, etc once endoscopy limitations related to COVID-19 pandemic are lifted - CT otherwise unrevealing for UGI or pancreaticobiliary pathology - No history of migraines.  Low clinical suspicion for abdominal migraines as etiology for cyclic nausea/vomiting -Discussed diagnosis of Cyclic Vomiting Syndrome pending response to therapy -Drink plenty of fluids -Frequent small meals -BRAT diet, with low fiber low fat foods  2) Hematemesis: Blood-streaked emesis this morning which has since resolved, without hemodynamic instability.  No anemia noted on labs.  No melena.  Suspect self-limiting MW tear. - Aggressive control of nausea/vomiting as above -If symptoms return or if associated HD instability, syncope, falls, chest pain, shortness of breath, melena, etc., to call the clinic or return to the ER for possible expedited endoscopic evaluation  3) Colonic wall thickening on CT: Left-sided thickening noted on CT.  Otherwise without lower GI symptoms.  Suspect some element of viral GE.  No plan for colonoscopy at this juncture.  4) Leukocytosis: Related to the above.  Recommend repeat WBC in 2 to 4 weeks at time of follow-up with his PCM.  5) AKI: Mild increase in creatinine above baseline.  Related to the above.  Treated with IV fluids  at ER. -Encouraged to drink plenty of fluids and eat frequent, small meals -Follow-up with his PCM   Codee Bloodworth V Braidan Ricciardi, DO, FACG  02/09/2019, 1:26 PM   No ref. provider found

## 2019-02-09 NOTE — Telephone Encounter (Signed)
Patient was at the ED yesterday 3-30 for nausea and vomiting. He was referred over to our office for a follow up. DOD 3-31 is DR. Cirigliano. Please advise for scheduling.

## 2019-02-09 NOTE — Patient Instructions (Addendum)
We have sent the following medications to your pharmacy for you to pick up at your convenience: Reglan 10mg  by mouth every 6 hours as needed for nausea.  To help prevent the possible spread of infection to our patients, communities, and staff; we will be implementing the following measures:  As of now we are not allowing any visitors/family members to accompany you to any upcoming appointments with [facility]. If you have any concerns about this please contact our office to discuss prior to the appointment.   It was a pleasure to see you today!  Vito Cirigliano, D.O.

## 2019-02-10 ENCOUNTER — Emergency Department (HOSPITAL_COMMUNITY)
Admission: EM | Admit: 2019-02-10 | Discharge: 2019-02-10 | Disposition: A | Discharge status: Home / Self Care | Payer: BC Managed Care – PPO | Attending: Emergency Medicine | Admitting: Emergency Medicine

## 2019-02-10 ENCOUNTER — Emergency Department (HOSPITAL_COMMUNITY)
Admission: EM | Admit: 2019-02-10 | Discharge: 2019-02-10 | Disposition: A | Discharge status: Home / Self Care | Payer: BC Managed Care – PPO | Source: Home / Self Care | Attending: Emergency Medicine | Admitting: Emergency Medicine

## 2019-02-10 ENCOUNTER — Other Ambulatory Visit: Payer: Self-pay

## 2019-02-10 ENCOUNTER — Encounter (HOSPITAL_COMMUNITY): Payer: Self-pay | Admitting: *Deleted

## 2019-02-10 ENCOUNTER — Encounter (HOSPITAL_COMMUNITY): Payer: Self-pay

## 2019-02-10 DIAGNOSIS — R112 Nausea with vomiting, unspecified: Secondary | ICD-10-CM | POA: Diagnosis present

## 2019-02-10 DIAGNOSIS — F12988 Cannabis use, unspecified with other cannabis-induced disorder: Secondary | ICD-10-CM | POA: Insufficient documentation

## 2019-02-10 DIAGNOSIS — F1210 Cannabis abuse, uncomplicated: Secondary | ICD-10-CM | POA: Insufficient documentation

## 2019-02-10 DIAGNOSIS — E876 Hypokalemia: Secondary | ICD-10-CM | POA: Insufficient documentation

## 2019-02-10 LAB — COMPREHENSIVE METABOLIC PANEL
ALT: 73 U/L — ABNORMAL HIGH (ref 0–44)
AST: 56 U/L — ABNORMAL HIGH (ref 15–41)
Albumin: 4.7 g/dL (ref 3.5–5.0)
Alkaline Phosphatase: 69 U/L (ref 38–126)
Anion gap: 15 (ref 5–15)
BUN: 16 mg/dL (ref 6–20)
CO2: 25 mmol/L (ref 22–32)
Calcium: 9.5 mg/dL (ref 8.9–10.3)
Chloride: 97 mmol/L — ABNORMAL LOW (ref 98–111)
Creatinine, Ser: 1.14 mg/dL (ref 0.61–1.24)
GFR calc Af Amer: >60 mL/min (ref >60)
GFR calc non Af Amer: >60 mL/min (ref >60)
Glucose, Bld: 113 mg/dL — ABNORMAL HIGH (ref 70–99)
Potassium: 2.9 mmol/L — ABNORMAL LOW (ref 3.5–5.1)
Sodium: 137 mmol/L (ref 135–145)
Total Bilirubin: 1.3 mg/dL — ABNORMAL HIGH (ref 0.3–1.2)
Total Protein: 8.7 g/dL — ABNORMAL HIGH (ref 6.5–8.1)

## 2019-02-10 LAB — CBC WITH DIFFERENTIAL/PLATELET
Abs Immature Granulocytes: 0.04 10*3/uL (ref 0.00–0.07)
Basophils Absolute: 0 10*3/uL (ref 0.0–0.1)
Basophils Relative: 0 %
Eosinophils Absolute: 0 10*3/uL (ref 0.0–0.5)
Eosinophils Relative: 0 %
HCT: 47.8 % (ref 39.0–52.0)
Hemoglobin: 15.3 g/dL (ref 13.0–17.0)
Immature Granulocytes: 0 %
Lymphocytes Relative: 26 %
Lymphs Abs: 2.8 10*3/uL (ref 0.7–4.0)
MCH: 29.2 pg (ref 26.0–34.0)
MCHC: 32 g/dL (ref 30.0–36.0)
MCV: 91.2 fL (ref 80.0–100.0)
Monocytes Absolute: 1 10*3/uL (ref 0.1–1.0)
Monocytes Relative: 9 %
Neutro Abs: 7.1 10*3/uL (ref 1.7–7.7)
Neutrophils Relative %: 65 %
Platelets: 206 10*3/uL (ref 150–400)
RBC: 5.24 MIL/uL (ref 4.22–5.81)
RDW: 12.4 % (ref 11.5–15.5)
WBC: 11.1 10*3/uL — ABNORMAL HIGH (ref 4.0–10.5)
nRBC: 0 % (ref 0.0–0.2)

## 2019-02-10 LAB — RAPID URINE DRUG SCREEN, HOSP PERFORMED
Amphetamines: NOT DETECTED
Barbiturates: NOT DETECTED
Benzodiazepines: NOT DETECTED
Cocaine: NOT DETECTED
Opiates: NOT DETECTED
Tetrahydrocannabinol: POSITIVE — AB

## 2019-02-10 MED ORDER — CAPSAICIN 0.025 % EX CREA: TOPICAL_CREAM | Freq: Two times a day (BID) | CUTANEOUS | 0 refills | Status: DC

## 2019-02-10 MED ORDER — CAPSAICIN 0.025 % EX CREA
TOPICAL_CREAM | Freq: Once | CUTANEOUS | Status: AC
  Administered 2019-02-10: 1 via TOPICAL
  Filled 2019-02-10: qty 60

## 2019-02-10 MED ORDER — SODIUM CHLORIDE 0.9 % IV BOLUS
1000.0000 mL | Freq: Once | INTRAVENOUS | Status: AC
  Administered 2019-02-10: 1000 mL via INTRAVENOUS

## 2019-02-10 MED ORDER — HALOPERIDOL LACTATE 5 MG/ML IJ SOLN
2.0000 mg | Freq: Once | INTRAMUSCULAR | Status: AC
  Administered 2019-02-10: 2 mg via INTRAVENOUS
  Filled 2019-02-10: qty 1

## 2019-02-10 MED ORDER — POTASSIUM CHLORIDE CRYS ER 20 MEQ PO TBCR
40.0000 meq | EXTENDED_RELEASE_TABLET | Freq: Once | ORAL | Status: AC
  Administered 2019-02-10: 40 meq via ORAL
  Filled 2019-02-10: qty 2

## 2019-02-10 NOTE — ED Triage Notes (Signed)
PT reports he wanted to go home and his girl friend made him ciome.

## 2019-02-10 NOTE — ED Triage Notes (Signed)
PT seen 3 times in three days. Pt seen at [facility] day. Pt has on going N/V

## 2019-02-10 NOTE — Discharge Instructions (Signed)
You were seen in the emergency department for nausea and vomiting.  You were offered lab work and IV fluids which you declined.  Please continue your regular medications and follow-up with GI.  Return if any concerns.

## 2019-02-10 NOTE — Discharge Instructions (Signed)
Continue using your medications.  Can also use the capsaicin cream on your abdomen twice daily. Will need to follow-up with GI. Return here for any new/acute changes.

## 2019-02-10 NOTE — ED Triage Notes (Signed)
Pt arrived stating he has been throwing up since Saturday morning. Pt states he has been prescribed three different medications and nothing is helping. Pt denies any other symptoms other than abdominal pain. Pt not actively vomiting in triage.

## 2019-02-10 NOTE — ED Notes (Signed)
Patient verbalizes understanding of discharge instructions . Opportunity for questions and answers were provided . Armband removed by staff ,Pt discharged from ED. W/C  offered at D/C  and Declined W/C at D/C and was escorted to lobby by RN.  

## 2019-02-10 NOTE — ED Provider Notes (Signed)
[facility]-EMERGENCY DEPT Provider Note   CSN: [id number] Arrival date & time: 02/10/19  2774    History   Chief Complaint Chief Complaint  Patient presents with  . Emesis    HPI Bruce Shelton is a 33 y.o. male.     The history is provided by the patient and medical records.  Emesis   33 y.o. M with hx of GERD, presenting to the ED for nausea/vomiting.  This has been ongoing for about 4 days now.  This is his 3rd ED visit since Sunday for same, also had e-visit yesterday with GI physician.  He has been prescribed zofran, reglan, compazine, and phenergan but states the medications are not helping.  He denies diarrhea, fever, chills, cough, or URI symptoms.  States he tries to eat/drink but it comes right back up.  He denies smoking marijuana, states none in the past month and half.  During ED visit yesterday with GI physician, agreed that symptoms likely due to cannabinoid hyperemesis.  Past Medical History:  Diagnosis Date  . GERD (gastroesophageal reflux disease)   . Hemorrhoid   . Hypokalemia     Patient Active Problem List   Diagnosis Date Noted  . Intractable vomiting 12/16/2017  . Cannabis abuse 12/16/2017  . Dehydration 07/02/2015  . AKI (acute kidney injury) (HCC) 07/02/2015  . Hemorrhoid     Past Surgical History:  Procedure Laterality Date  . HEMORRHOID SURGERY N/A 07/03/2015   Procedure: Exam under anesthesia, HEMORRHOIDECTOMY;  Surgeon: Avel Peace, MD;  Location: [facility];  Service: General;  Laterality: N/A;  . HERNIA REPAIR    . WISDOM TOOTH EXTRACTION  01/27/2019        Home Medications    Prior to Admission medications   Medication Sig Start Date End Date Taking? Authorizing Provider  metoCLOPramide (REGLAN) 10 MG tablet Take 1 tablet (10 mg total) by mouth every 6 (six) hours as needed for nausea. 02/09/19 04/06/19 Yes Cirigliano, Vito V, DO  ondansetron (ZOFRAN ODT) 4 MG disintegrating tablet 4mg  ODT q4 hours prn  nausea/vomit 02/08/19  Yes Benjiman Core, MD  prochlorperazine (COMPAZINE) 10 MG tablet Take 1 tablet (10 mg total) by mouth 2 (two) times daily as needed for nausea or vomiting. 02/08/19  Yes Benjiman Core, MD    Family History Family History  Problem Relation Age of Onset  . Colon cancer Neg Hx     Social History Social History   Tobacco Use  . Smoking status: Never Smoker  . Smokeless tobacco: Never Used  Substance Use Topics  . Alcohol use: No  . Drug use: Yes    Types: Marijuana     Allergies   Patient has no known allergies.   Review of Systems Review of Systems  Gastrointestinal: Positive for nausea and vomiting.  All other systems reviewed and are negative.    Physical Exam Updated Vital Signs BP (!) 131/94 (BP Location: Right Arm)   Pulse 61   Temp 98.9 F (37.2 C) (Oral)   Resp 16   Ht 6\' 1"  (1.854 m)   Wt 81.6 kg   SpO2 100%   BMI 23.75 kg/m   Physical Exam Vitals signs and nursing note reviewed.  Constitutional:      Appearance: He is well-developed.  HENT:     Head: Normocephalic and atraumatic.  Eyes:     Conjunctiva/sclera: Conjunctivae normal.     Pupils: Pupils are equal, round, and reactive to light.  Neck:     Musculoskeletal:  Normal range of motion.  Cardiovascular:     Rate and Rhythm: Normal rate and regular rhythm.     Heart sounds: Normal heart sounds.  Pulmonary:     Effort: Pulmonary effort is normal.     Breath sounds: Normal breath sounds.  Abdominal:     General: Bowel sounds are normal.     Palpations: Abdomen is soft.     Tenderness: There is no abdominal tenderness. There is no guarding or rebound.  Musculoskeletal: Normal range of motion.  Skin:    General: Skin is warm and dry.  Neurological:     Mental Status: He is alert and oriented to person, place, and time.      ED Treatments / Results  Labs (all labs ordered are listed, but only abnormal results are displayed) Labs Reviewed  CBC WITH  DIFFERENTIAL/PLATELET - Abnormal; Notable for the following components:      Result Value   WBC 11.1 (*)    All other components within normal limits  COMPREHENSIVE METABOLIC PANEL - Abnormal; Notable for the following components:   Potassium 2.9 (*)    Chloride 97 (*)    Glucose, Bld 113 (*)    Total Protein 8.7 (*)    AST 56 (*)    ALT 73 (*)    Total Bilirubin 1.3 (*)    All other components within normal limits  RAPID URINE DRUG SCREEN, HOSP PERFORMED - Abnormal; Notable for the following components:   Tetrahydrocannabinol POSITIVE (*)    All other components within normal limits    EKG None  Radiology No results found.  Procedures Procedures (including critical care time)  Medications Ordered in ED Medications  potassium chloride SA (K-DUR,KLOR-CON) CR tablet 40 mEq (has no administration in time range)  sodium chloride 0.9 % bolus 1,000 mL (0 mLs Intravenous Stopped 02/10/19 0525)  haloperidol lactate (HALDOL) injection 2 mg (2 mg Intravenous Given 02/10/19 0405)  capsaicin (ZOSTRIX) 0.025 % cream (1 application Topical Given 02/10/19 0406)     Initial Impression / Assessment and Plan / ED Course  I have reviewed the triage vital signs and the nursing notes.  Pertinent labs & imaging results that were available during my care of the patient were reviewed by me and considered in my medical decision making (see chart for details).  33 y.o. M here with N/V.  This is his 3rd ED visit in 4 days for same, also had e-visit with GI doctor yesterday.  Evaluations are most consistent with cannabinoid hyperemesis.  He states he has not smoked marijuana in about a month and a half.  Denies any other drug use.  He is not actively vomiting here, abdomen is soft and benign.  Vitals are stable.  Plan for IV fluids, Haldol, capsaicin.  Screening labs sent.  Head CT earlier in the week with questionable colitis, however patient has not had any diarrhea, fever, or acute changes in his symptoms  so I feel this is unlikely.  5:25 AM Patient sleeping.  Vitals are stable.  Labs are reassuring aside from mildly low potassium.  UDS remains positive for THC.  He seems to have responded well to haldol and capsaicin here in the ED.   He has not had any active emesis here in the ED.  Stable for discharge.  Again encouraged marijuana cessation.  Can follow-up with GI.  Continue home anti-emetics.  Return here for any new/acute changes.  Final Clinical Impressions(s) / ED Diagnoses   Final diagnoses:  Cannabinoid hyperemesis syndrome (HCC)  Hypokalemia    ED Discharge Orders         Ordered    capsaicin (ZOSTRIX) 0.025 % cream  2 times daily     02/10/19 0521           Garlon HatchetSanders, Kanton Kamel M, PA-C 02/10/19 0536    Gilda CreasePollina, Christopher J, MD 02/12/19 [phone]

## 2019-02-13 NOTE — ED Provider Notes (Signed)
[facility] EMERGENCY DEPARTMENT Provider Note   CSN: [id number] Arrival date & time: 02/10/19  1639    History   Chief Complaint Chief Complaint  Patient presents with   Emesis    HPI Bruce Shelton is a 33 y.o. male.He presents with n/v that has been going on for weeks. Just seen again about 12 hours ago for same. No fever. Has seen GI for this. States con not tolerate water.      The history is provided by the patient.  Emesis  Severity:  Severe Timing:  Intermittent Quality:  Bilious material and stomach contents Progression:  Unchanged Chronicity:  Recurrent Recent urination:  Normal Context: not post-tussive and not self-induced   Relieved by:  Nothing Worsened by:  Nothing Ineffective treatments:  Antiemetics Associated symptoms: no abdominal pain, no cough, no diarrhea, no fever, no headaches and no sore throat   Risk factors: no suspect food intake and no travel to endemic areas     Past Medical History:  Diagnosis Date   GERD (gastroesophageal reflux disease)    Hemorrhoid    Hypokalemia     Patient Active Problem List   Diagnosis Date Noted   Intractable vomiting 12/16/2017   Cannabis abuse 12/16/2017   Dehydration 07/02/2015   AKI (acute kidney injury) (HCC) 07/02/2015   Hemorrhoid     Past Surgical History:  Procedure Laterality Date   HEMORRHOID SURGERY N/A 07/03/2015   Procedure: Exam under anesthesia, HEMORRHOIDECTOMY;  Surgeon: Avel Peace, MD;  Location: [facility];  Service: General;  Laterality: N/A;   HERNIA REPAIR     WISDOM TOOTH EXTRACTION  01/27/2019        Home Medications    Prior to Admission medications   Medication Sig Start Date End Date Taking? Authorizing Provider  capsaicin (ZOSTRIX) 0.025 % cream Apply topically 2 (two) times daily. 02/10/19   Garlon Hatchet, PA-C  metoCLOPramide (REGLAN) 10 MG tablet Take 1 tablet (10 mg total) by mouth every 6 (six) hours as needed for nausea. 02/09/19  04/06/19  Cirigliano, Vito V, DO  ondansetron (ZOFRAN ODT) 4 MG disintegrating tablet 4mg  ODT q4 hours prn nausea/vomit 02/08/19   Benjiman Core, MD  prochlorperazine (COMPAZINE) 10 MG tablet Take 1 tablet (10 mg total) by mouth 2 (two) times daily as needed for nausea or vomiting. 02/08/19   Benjiman Core, MD    Family History Family History  Problem Relation Age of Onset   Colon cancer Neg Hx     Social History Social History   Tobacco Use   Smoking status: Never Smoker   Smokeless tobacco: Never Used  Substance Use Topics   Alcohol use: No   Drug use: Yes    Types: Marijuana     Allergies   Patient has no known allergies.   Review of Systems Review of Systems  Constitutional: Negative for fever.  HENT: Negative for sore throat.   Eyes: Negative for visual disturbance.  Respiratory: Negative for cough and shortness of breath.   Cardiovascular: Negative for chest pain.  Gastrointestinal: Positive for nausea and vomiting. Negative for abdominal pain and diarrhea.  Genitourinary: Negative for dysuria.  Musculoskeletal: Negative for neck pain.  Skin: Negative for rash.  Neurological: Negative for headaches.     Physical Exam Updated Vital Signs BP 125/86    Pulse 88    Temp 98.8 F (37.1 C) (Oral)    Resp 16    Ht 6\' 1"  (1.854 m)    Wt 81.6  kg    SpO2 99%    BMI 23.73 kg/m   Physical Exam Vitals signs and nursing note reviewed.  Constitutional:      Appearance: He is well-developed.  HENT:     Head: Normocephalic and atraumatic.  Eyes:     Conjunctiva/sclera: Conjunctivae normal.  Neck:     Musculoskeletal: Neck supple.  Cardiovascular:     Rate and Rhythm: Normal rate and regular rhythm.     Heart sounds: No murmur.  Pulmonary:     Effort: Pulmonary effort is normal. No respiratory distress.     Breath sounds: Normal breath sounds.  Abdominal:     Palpations: Abdomen is soft.     Tenderness: There is no abdominal tenderness.    Musculoskeletal: Normal range of motion.     Right lower leg: No edema.     Left lower leg: No edema.  Skin:    General: Skin is warm and dry.     Capillary Refill: Capillary refill takes less than 2 seconds.  Neurological:     General: No focal deficit present.     Mental Status: He is alert.     Gait: Gait normal.      ED Treatments / Results  Labs (all labs ordered are listed, but only abnormal results are displayed) Labs Reviewed - No data to display  EKG None  Radiology No results found.  Procedures Procedures (including critical care time)  Medications Ordered in ED Medications - No data to display   Initial Impression / Assessment and Plan / ED Course  I have reviewed the triage vital signs and the nursing notes.  Pertinent labs & imaging results that were available during my care of the patient were reviewed by me and considered in my medical decision making (see chart for details).  Clinical Course as of Feb 12 1057  Wed Feb 10, 2019  1653 Patient here 3 times in 3 days for similar symptoms of nausea and vomiting inability to hold anything down.  I reviewed the prior visits.  Differential includes hyper emesis cannabis syndrome, a GE, obstruction, gastritis.  He has followed with GI and had a recent CT we did not show any sign of obstruction.  I offered him an IV fluids and some medication but ultimately he became frustrated when he was accommodated in a chair and he wanted a bed to lay down then.  He was informed that the chair could lay flat but he is asking to be discharged.   [MB]    Clinical Course User Index [MB] Terrilee Files, MD        Final Clinical Impressions(s) / ED Diagnoses   Final diagnoses:  Nausea and vomiting, intractability of vomiting not specified, unspecified vomiting type    ED Discharge Orders    None       Terrilee Files, MD 02/13/19 1100

## 2019-08-13 ENCOUNTER — Ambulatory Visit
Admission: EM | Admit: 2019-08-13 | Discharge: 2019-08-13 | Disposition: A | Discharge status: Home / Self Care | Payer: BC Managed Care – PPO | Attending: Physician Assistant | Admitting: Physician Assistant

## 2019-08-13 ENCOUNTER — Other Ambulatory Visit: Payer: Self-pay

## 2019-08-13 DIAGNOSIS — T2122XA Burn of second degree of abdominal wall, initial encounter: Secondary | ICD-10-CM | POA: Diagnosis not present

## 2019-08-13 DIAGNOSIS — T24212A Burn of second degree of left thigh, initial encounter: Secondary | ICD-10-CM

## 2019-08-13 DIAGNOSIS — X131XXA Other contact with steam and other hot vapors, initial encounter: Secondary | ICD-10-CM | POA: Diagnosis not present

## 2019-08-13 DIAGNOSIS — T24211A Burn of second degree of right thigh, initial encounter: Secondary | ICD-10-CM | POA: Diagnosis not present

## 2019-08-13 MED ORDER — SILVER SULFADIAZINE 1 % EX CREA: 1.0000 "application " | TOPICAL_CREAM | Freq: Every day | CUTANEOUS | 1 refills | Status: DC
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
## 2019-08-13 NOTE — Discharge Instructions (Signed)
No signs of infection at this time. Start silvadene as directed. Ice compress. Can clean gently with soap and water. Monitor for spreading redness, increased warmth, increased pain, follow up for reevaluation needed.

## 2019-08-13 NOTE — ED Notes (Signed)
Pt's wound on left upper thigh was cleaned with warm water and gauze. Thick layer of silvadene cream is placed on burn sites on left and right upper thighs. Nonadherent dressing placed over cream as well as kerlex gauze on both legs and ace wrap on right leg. Pt tolerated wound dressing well and was instructed on how to perform dressing.
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
## 2019-08-19 ENCOUNTER — Telehealth: Payer: Self-pay | Admitting: Emergency Medicine

## 2019-08-19 NOTE — Telephone Encounter (Signed)
Pt called and asked when he could stop wrapping his wounds.  Confirmed with Amy, APP that patient can leave uncovered now that the blisters are open, not draining, and flattened.  Patient verbalized understanding.

## 2020-03-04 ENCOUNTER — Emergency Department (HOSPITAL_COMMUNITY)
Admission: EM | Admit: 2020-03-04 | Discharge: 2020-03-04 | Disposition: A | Discharge status: Home / Self Care | Payer: BC Managed Care – PPO | Attending: Emergency Medicine | Admitting: Emergency Medicine

## 2020-03-04 ENCOUNTER — Other Ambulatory Visit: Payer: Self-pay

## 2020-03-04 DIAGNOSIS — A084 Viral intestinal infection, unspecified: Secondary | ICD-10-CM | POA: Diagnosis not present

## 2020-03-04 DIAGNOSIS — F1210 Cannabis abuse, uncomplicated: Secondary | ICD-10-CM | POA: Insufficient documentation

## 2020-03-04 DIAGNOSIS — R112 Nausea with vomiting, unspecified: Secondary | ICD-10-CM | POA: Insufficient documentation

## 2020-03-04 DIAGNOSIS — R1084 Generalized abdominal pain: Secondary | ICD-10-CM | POA: Insufficient documentation

## 2020-03-04 DIAGNOSIS — K219 Gastro-esophageal reflux disease without esophagitis: Secondary | ICD-10-CM | POA: Diagnosis not present

## 2020-03-04 LAB — URINALYSIS, ROUTINE W REFLEX MICROSCOPIC
Bacteria, UA: NONE SEEN
Bilirubin Urine: NEGATIVE
Glucose, UA: NEGATIVE mg/dL
Ketones, ur: 5 mg/dL — AB
Leukocytes,Ua: NEGATIVE
Nitrite: NEGATIVE
Protein, ur: 30 mg/dL — AB
Specific Gravity, Urine: 1.032 — ABNORMAL HIGH (ref 1.005–1.030)
pH: 5 (ref 5.0–8.0)

## 2020-03-04 LAB — COMPREHENSIVE METABOLIC PANEL
ALT: 58 U/L — ABNORMAL HIGH (ref 0–44)
AST: 46 U/L — ABNORMAL HIGH (ref 15–41)
Albumin: 4.8 g/dL (ref 3.5–5.0)
Alkaline Phosphatase: 79 U/L (ref 38–126)
Anion gap: 13 (ref 5–15)
BUN: 21 mg/dL — ABNORMAL HIGH (ref 6–20)
CO2: 21 mmol/L — ABNORMAL LOW (ref 22–32)
Calcium: 9.7 mg/dL (ref 8.9–10.3)
Chloride: 102 mmol/L (ref 98–111)
Creatinine, Ser: 1.16 mg/dL (ref 0.61–1.24)
GFR calc Af Amer: >60 mL/min (ref >60)
GFR calc non Af Amer: >60 mL/min (ref >60)
Glucose, Bld: 141 mg/dL — ABNORMAL HIGH (ref 70–99)
Potassium: 3.8 mmol/L (ref 3.5–5.1)
Sodium: 136 mmol/L (ref 135–145)
Total Bilirubin: 0.8 mg/dL (ref 0.3–1.2)
Total Protein: 9.3 g/dL — ABNORMAL HIGH (ref 6.5–8.1)

## 2020-03-04 LAB — CBC WITH DIFFERENTIAL/PLATELET
Abs Immature Granulocytes: 0.04 10*3/uL (ref 0.00–0.07)
Basophils Absolute: 0 10*3/uL (ref 0.0–0.1)
Basophils Relative: 0 %
Eosinophils Absolute: 0 10*3/uL (ref 0.0–0.5)
Eosinophils Relative: 0 %
HCT: 50.4 % (ref 39.0–52.0)
Hemoglobin: 16.9 g/dL (ref 13.0–17.0)
Immature Granulocytes: 0 %
Lymphocytes Relative: 11 %
Lymphs Abs: 1.1 10*3/uL (ref 0.7–4.0)
MCH: 29.3 pg (ref 26.0–34.0)
MCHC: 33.5 g/dL (ref 30.0–36.0)
MCV: 87.5 fL (ref 80.0–100.0)
Monocytes Absolute: 0.5 10*3/uL (ref 0.1–1.0)
Monocytes Relative: 5 %
Neutro Abs: 8.3 10*3/uL — ABNORMAL HIGH (ref 1.7–7.7)
Neutrophils Relative %: 84 %
Platelets: 241 10*3/uL (ref 150–400)
RBC: 5.76 MIL/uL (ref 4.22–5.81)
RDW: 13 % (ref 11.5–15.5)
WBC: 10 10*3/uL (ref 4.0–10.5)
nRBC: 0 % (ref 0.0–0.2)

## 2020-03-04 LAB — RAPID URINE DRUG SCREEN, HOSP PERFORMED
Amphetamines: NOT DETECTED
Barbiturates: NOT DETECTED
Benzodiazepines: NOT DETECTED
Cocaine: NOT DETECTED
Opiates: NOT DETECTED
Tetrahydrocannabinol: POSITIVE — AB

## 2020-03-04 LAB — LIPASE, BLOOD: Lipase: 20 U/L (ref 11–51)

## 2020-03-04 MED ORDER — SODIUM CHLORIDE 0.9 % IV BOLUS
1000.0000 mL | Freq: Once | INTRAVENOUS | Status: AC
  Administered 2020-03-04: 1000 mL via INTRAVENOUS

## 2020-03-04 MED ORDER — DICYCLOMINE HCL 20 MG PO TABS
20.0000 mg | ORAL_TABLET | Freq: Two times a day (BID) | ORAL | 0 refills | Status: DC
Start: 2020-03-04 — End: 2023-02-28

## 2020-03-04 MED ORDER — ONDANSETRON HCL 4 MG/2ML IJ SOLN
4.0000 mg | Freq: Once | INTRAMUSCULAR | Status: AC
  Administered 2020-03-04: 4 mg via INTRAVENOUS
  Filled 2020-03-04: qty 2

## 2020-03-04 NOTE — Discharge Instructions (Signed)
Please pick up medication and take as needed for abdominal cramping Continue taking your Zofran as needed Drink plenty of fluids to stay hydrated  Follow up with your PCP regarding your ED visit today Return to the ED for any worsening symptoms including excessive vomiting despite nausea medications, worsening abdominal pain, fevers > 100.4, blood in stool, diarrhea > 10 days

## 2020-03-04 NOTE — ED Triage Notes (Signed)
Pt c/o N/V/D for three days. Pt unable to keep food down

## 2020-03-04 NOTE — ED Provider Notes (Signed)
[facility] DEPT Provider Note   CSN: [id number] Arrival date & time: 03/04/20  7412     History Chief Complaint  Patient presents with  . Nausea    Bruce Shelton is a 34 y.o. male with PMHx GERD and intractable vomiting s/2 CHS who presents to the ED today with complaint of N/V/D x 3 days. Pt reports he began having watery diarrhea 3 days ago with nausea. He did not start having emesis until this morning. Reports about 6 episodes of NBNB emesis since waking up this morning. Pt reports that he began having abdominal cramping as well today s/2 vomiting. He was around his family who all had a stomach bug last week. He has been taking Zofran ODT without relief. Denies fevers, chills, urinary sx, testicular pain/swelling, or any other associated symptoms.   The history is provided by the patient and medical records.       Past Medical History:  Diagnosis Date  . GERD (gastroesophageal reflux disease)   . Hemorrhoid   . Hypokalemia     Patient Active Problem List   Diagnosis Date Noted  . Intractable vomiting 12/16/2017  . Cannabis abuse 12/16/2017  . Dehydration 07/02/2015  . AKI (acute kidney injury) ([place]) 07/02/2015  . Hemorrhoid     Past Surgical History:  Procedure Laterality Date  . HEMORRHOID SURGERY N/A 07/03/2015   Procedure: Exam under anesthesia, HEMORRHOIDECTOMY;  Surgeon: Jackolyn Confer, MD;  Location: [facility];  Service: General;  Laterality: N/A;  . HERNIA REPAIR    . WISDOM TOOTH EXTRACTION  01/27/2019       Family History  Problem Relation Age of Onset  . Colon cancer Neg Hx     Social History   Tobacco Use  . Smoking status: Never Smoker  . Smokeless tobacco: Never Used  Substance Use Topics  . Alcohol use: No  . Drug use: Yes    Types: Marijuana    Home Medications Prior to Admission medications   Medication Sig Start Date End Date Taking? Authorizing Provider  loratadine (CLARITIN) 10 MG tablet Take 10 mg by  mouth daily as needed for allergies.   Yes [provider]  dicyclomine (BENTYL) 20 MG tablet Take 1 tablet (20 mg total) by mouth 2 (two) times daily. 03/04/20   Alroy Bailiff, Amirr Achord, PA-C  ondansetron (ZOFRAN-ODT) 4 MG disintegrating tablet Take 4 mg by mouth every 8 (eight) hours as needed for nausea or vomiting.  12/27/19   [provider]  silver sulfADIAZINE (SILVADENE) 1 % cream Apply 1 application topically daily. Patient not taking: Reported on 03/04/2020 08/13/19   Ok Edwards, PA-C  metoCLOPramide (REGLAN) 10 MG tablet Take 1 tablet (10 mg total) by mouth every 6 (six) hours as needed for nausea. 02/09/19 08/13/19  Cirigliano, Vito V, DO  prochlorperazine (COMPAZINE) 10 MG tablet Take 1 tablet (10 mg total) by mouth 2 (two) times daily as needed for nausea or vomiting. 02/08/19 08/13/19  Davonna Belling, MD    Allergies    Patient has no known allergies.  Review of Systems   Review of Systems  Constitutional: Negative for chills and fever.  Gastrointestinal: Positive for abdominal pain, diarrhea, nausea and vomiting.  Genitourinary: Negative for dysuria, scrotal swelling and testicular pain.  All other systems reviewed and are negative.   Physical Exam Updated Vital Signs BP (!) 115/96 (BP Location: Left Arm)   Pulse (!) 117   Temp 98.5 F (36.9 C) (Oral)   Resp 17  Ht 6\' 1"  (1.854 m)   Wt 77.1 kg   SpO2 100%   BMI 22.43 kg/m   Physical Exam Vitals and nursing note reviewed.  Constitutional:      Appearance: He is not ill-appearing or diaphoretic.  HENT:     Head: Normocephalic and atraumatic.     Mouth/Throat:     Mouth: Mucous membranes are dry.  Eyes:     Conjunctiva/sclera: Conjunctivae normal.  Cardiovascular:     Rate and Rhythm: Normal rate and regular rhythm.     Pulses: Normal pulses.  Pulmonary:     Effort: Pulmonary effort is normal.     Breath sounds: Normal breath sounds. No wheezing, rhonchi or rales.  Abdominal:     Palpations: Abdomen  is soft.     Tenderness: There is abdominal tenderness. There is no right CVA tenderness, left CVA tenderness, guarding or rebound.     Comments: Soft, + diffuse abdominal TTP, +BS throughout, no r/g/r, neg murphy's, neg mcburney's, no CVA TTP  Musculoskeletal:     Cervical back: Neck supple.  Skin:    General: Skin is warm and dry.  Neurological:     Mental Status: He is alert.     ED Results / Procedures / Treatments   Labs (all labs ordered are listed, but only abnormal results are displayed) Labs Reviewed  COMPREHENSIVE METABOLIC PANEL - Abnormal; Notable for the following components:      Result Value   CO2 21 (*)    Glucose, Bld 141 (*)    BUN 21 (*)    Total Protein 9.3 (*)    AST 46 (*)    ALT 58 (*)    All other components within normal limits  CBC WITH DIFFERENTIAL/PLATELET - Abnormal; Notable for the following components:   Neutro Abs 8.3 (*)    All other components within normal limits  URINALYSIS, ROUTINE W REFLEX MICROSCOPIC - Abnormal; Notable for the following components:   Specific Gravity, Urine 1.032 (*)    Hgb urine dipstick SMALL (*)    Ketones, ur 5 (*)    Protein, ur 30 (*)    All other components within normal limits  RAPID URINE DRUG SCREEN, HOSP PERFORMED - Abnormal; Notable for the following components:   Tetrahydrocannabinol POSITIVE (*)    All other components within normal limits  LIPASE, BLOOD    EKG None  Radiology No results found.  Procedures Procedures (including critical care time)  Medications Ordered in ED Medications  sodium chloride 0.9 % bolus 1,000 mL (0 mLs Intravenous Stopped 03/04/20 0930)  ondansetron (ZOFRAN) injection 4 mg (4 mg Intravenous Given 03/04/20 03/06/20)    ED Course  I have reviewed the triage vital signs and the nursing notes.  Pertinent labs & imaging results that were available during my care of the patient were reviewed by me and considered in my medical decision making (see chart for  details).  Clinical Course as of Mar 04 1121  Sat Mar 04, 2020  1026 Tetrahydrocannabinol(!): POSITIVE [MV]    Clinical Course User Index [MV] Mar 06, 2020, Tanda Rockers   MDM Rules/Calculators/A&P                      34 year old male who presents to the ED today complaining of nausea, vomiting, diarrhea for the past 3 days.  Reports he was around family who had a stomach bug last week.  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  Arrival to the ED patient is tachycardic in the 110s.  He is afebrile nontachypneic.  Suspect his tachycardia is likely due to dehydration as his mucous membranes appear dry.  Will give fluids through the IV and reevaluate.  Will work-up for nausea and vomiting today.  On exam patient does have diffuse abdominal tenderness palpation however no peritoneal signs.  Suspect this is likely due to his multiple episodes of emesis this morning.  We will continue to monitor.   CBC without leukocytosis. Hgb stable.  CMP with glucose 141. Creatinine stable compared to baseline. Bicarb 21. No gap. BUN 21. Pt receiving fluids.   Lab Results  Component Value Date   CREATININE 1.16 03/04/2020   CREATININE 1.14 02/10/2019   CREATININE 1.20 02/07/2019   Lipase 20.  U/A with trace hgb however it appears pt has had this in the past.  UDS positive for THC. There was concern during past visits for CHS. Pt reports he only smokes occassionally now. Still suspicion of viral gastroenteritis over [facility] given recent exposure to family members with similar symptoms  On reeval pt reports improvement in symptoms. He has been able to tolerate ice chips as well as gingerale without anymore emesis. Feel he is stable for dispo home at this time. Will dispo with bentyl for symptomatic relief. Pt has ODT zofran at home. Encouraged increased oral fluids and to follow up with PCP. Strict return precautions have been discussed with pt. He is  in agreement with plan and stable for discharge home.   This note was prepared using Dragon voice recognition software and may include unintentional dictation errors due to the inherent limitations of voice recognition software.   Final Clinical Impression(s) / ED Diagnoses Final diagnoses:  Nausea, vomiting and diarrhea  Viral gastroenteritis    Rx / DC Orders ED Discharge Orders         Ordered    dicyclomine (BENTYL) 20 MG tablet  2 times daily     03/04/20 1108           Discharge Instructions     Please pick up medication and take as needed for abdominal cramping Continue taking your Zofran as needed Drink plenty of fluids to stay hydrated  Follow up with your PCP regarding your ED visit today Return to the ED for any worsening symptoms including excessive vomiting despite nausea medications, worsening abdominal pain, fevers > 100.4, blood in stool, diarrhea > 10 days       Tanda Rockers, Cordelia Poche 03/04/20 1122    Benjiman Core, MD 03/04/20 1350

## 2021-11-12 ENCOUNTER — Telehealth (HOSPITAL_BASED_OUTPATIENT_CLINIC_OR_DEPARTMENT_OTHER): Payer: Self-pay | Admitting: Emergency Medicine

## 2021-11-12 ENCOUNTER — Encounter (HOSPITAL_BASED_OUTPATIENT_CLINIC_OR_DEPARTMENT_OTHER): Payer: Self-pay | Admitting: Obstetrics and Gynecology

## 2021-11-12 ENCOUNTER — Other Ambulatory Visit: Payer: Self-pay

## 2021-11-12 ENCOUNTER — Emergency Department (HOSPITAL_BASED_OUTPATIENT_CLINIC_OR_DEPARTMENT_OTHER)
Admission: EM | Admit: 2021-11-12 | Discharge: 2021-11-12 | Disposition: A | Discharge status: Home / Self Care | Payer: BC Managed Care – PPO | Attending: Emergency Medicine | Admitting: Emergency Medicine

## 2021-11-12 ENCOUNTER — Emergency Department (HOSPITAL_BASED_OUTPATIENT_CLINIC_OR_DEPARTMENT_OTHER): Payer: BC Managed Care – PPO | Admitting: Radiology

## 2021-11-12 DIAGNOSIS — W228XXA Striking against or struck by other objects, initial encounter: Secondary | ICD-10-CM | POA: Diagnosis not present

## 2021-11-12 DIAGNOSIS — S60511A Abrasion of right hand, initial encounter: Secondary | ICD-10-CM | POA: Diagnosis not present

## 2021-11-12 DIAGNOSIS — Y9289 Other specified places as the place of occurrence of the external cause: Secondary | ICD-10-CM | POA: Insufficient documentation

## 2021-11-12 DIAGNOSIS — S62141A Displaced fracture of body of hamate [unciform] bone, right wrist, initial encounter for closed fracture: Secondary | ICD-10-CM | POA: Insufficient documentation

## 2021-11-12 DIAGNOSIS — S62326B Displaced fracture of shaft of fifth metacarpal bone, right hand, initial encounter for open fracture: Secondary | ICD-10-CM | POA: Diagnosis not present

## 2021-11-12 DIAGNOSIS — S62314A Displaced fracture of base of fourth metacarpal bone, right hand, initial encounter for closed fracture: Secondary | ICD-10-CM | POA: Insufficient documentation

## 2021-11-12 DIAGNOSIS — Y9389 Activity, other specified: Secondary | ICD-10-CM | POA: Diagnosis not present

## 2021-11-12 DIAGNOSIS — S6991XA Unspecified injury of right wrist, hand and finger(s), initial encounter: Secondary | ICD-10-CM | POA: Diagnosis present

## 2021-11-12 MED ORDER — HYDROCODONE-ACETAMINOPHEN 5-325 MG PO TABS: 1.0000 | ORAL_TABLET | Freq: Four times a day (QID) | ORAL | 0 refills | Status: DC | PRN

## 2021-11-12 MED ORDER — CEPHALEXIN 500 MG PO CAPS: 500.0000 mg | ORAL_CAPSULE | Freq: Four times a day (QID) | ORAL | 0 refills | Status: DC

## 2021-11-12 MED ORDER — CEPHALEXIN 250 MG PO CAPS
500.0000 mg | ORAL_CAPSULE | Freq: Once | ORAL | Status: AC
  Administered 2021-11-12: 500 mg via ORAL
  Filled 2021-11-12: qty 2

## 2021-11-12 MED ORDER — HYDROCODONE-ACETAMINOPHEN 5-325 MG PO TABS
1.0000 | ORAL_TABLET | Freq: Once | ORAL | Status: AC
  Administered 2021-11-12: 1 via ORAL
  Filled 2021-11-12: qty 1

## 2021-11-12 MED ORDER — OXYCODONE-ACETAMINOPHEN 5-325 MG PO TABS
1.0000 | ORAL_TABLET | ORAL | Status: DC | PRN
  Administered 2021-11-12: 1 via ORAL
  Filled 2021-11-12: qty 1

## 2021-11-12 NOTE — Telephone Encounter (Signed)
Patient had meds sent to [facility] because [facility]
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
## 2021-11-12 NOTE — ED Provider Notes (Signed)
[facility] Provider Note   CSN: [id number] Arrival date & time: 11/12/21  1712     History  Chief Complaint  Patient presents with   Hand Injury    Bruce Shelton is a 36 y.o. male.  The history is provided by the patient.  Hand Injury Location:  Hand and wrist Wrist location:  R wrist Hand location:  R hand Injury: yes   Time since incident:  1 hour Mechanism of injury comment:  Punched a wall Pain details:    Quality:  Aching, shooting and throbbing   Radiates to:  Does not radiate   Severity:  Moderate   Onset quality:  Sudden   Timing:  Constant   Progression:  Unchanged Handedness:  Right-handed Prior injury to area:  No Relieved by:  None tried Worsened by:  Movement Ineffective treatments:  None tried Associated symptoms: decreased range of motion and swelling   Risk factors comment:  Healthy with no medical problems     Home Medications Prior to Admission medications   Medication Sig Start Date End Date Taking? Authorizing Provider  dicyclomine (BENTYL) 20 MG tablet Take 1 tablet (20 mg total) by mouth 2 (two) times daily. 03/04/20   Alroy Bailiff, Margaux, PA-C  loratadine (CLARITIN) 10 MG tablet Take 10 mg by mouth daily as needed for allergies.    [provider]  ondansetron (ZOFRAN-ODT) 4 MG disintegrating tablet Take 4 mg by mouth every 8 (eight) hours as needed for nausea or vomiting.  12/27/19   [provider]  silver sulfADIAZINE (SILVADENE) 1 % cream Apply 1 application topically daily. Patient not taking: Reported on 03/04/2020 08/13/19   Ok Edwards, PA-C  metoCLOPramide (REGLAN) 10 MG tablet Take 1 tablet (10 mg total) by mouth every 6 (six) hours as needed for nausea. 02/09/19 08/13/19  Cirigliano, Vito V, DO  prochlorperazine (COMPAZINE) 10 MG tablet Take 1 tablet (10 mg total) by mouth 2 (two) times daily as needed for nausea or vomiting. 02/08/19 08/13/19  Davonna Belling, MD      Allergies    Patient has  no known allergies.    Review of Systems   Review of Systems  All other systems reviewed and are negative.  Physical Exam Updated Vital Signs BP (!) 138/98    Pulse 77    Temp 97.9 F (36.6 C)    Resp 18    Ht 6\' 1"  (1.854 m)    Wt 93.9 kg    SpO2 97%    BMI 27.31 kg/m  Physical Exam Vitals and nursing note reviewed.  Constitutional:      General: He is not in acute distress.    Appearance: He is well-developed.  HENT:     Head: Normocephalic and atraumatic.  Eyes:     Conjunctiva/sclera: Conjunctivae normal.     Pupils: Pupils are equal, round, and reactive to light.  Cardiovascular:     Pulses: Normal pulses.     Heart sounds: No murmur heard. Pulmonary:     Effort: Pulmonary effort is normal. No respiratory distress.  Abdominal:     Tenderness: There is no abdominal tenderness.  Musculoskeletal:        General: Tenderness, deformity and signs of injury present. Normal range of motion.       Hands:     Cervical back: Normal range of motion and neck supple.     Comments: Swelling and tenderness noted to the right 5th digit.  Able to flex and extend  at the MCP, PIP and DIP joint of all fingers on the right hand.  Normal sensation.  Pain over the ulnar side of the wrist but no radial or ulna tenderness.    Skin:    General: Skin is warm and dry.     Findings: No erythema or rash.  Neurological:     Mental Status: He is alert and oriented to person, place, and time.  Psychiatric:        Mood and Affect: Mood normal.        Behavior: Behavior normal.    ED Results / Procedures / Treatments   Labs (all labs ordered are listed, but only abnormal results are displayed) Labs Reviewed - No data to display  EKG None  Radiology DG Hand Complete Right  Result Date: 11/12/2021 CLINICAL DATA:  Right hand swelling and pain after punching a wall. EXAM: RIGHT HAND - COMPLETE 3+ VIEW COMPARISON:  None. FINDINGS: There is an oblique, mildly displaced and volarly angulated fracture  of the distal shaft of the fifth metacarpal with overlying soft tissue swelling. A bone fragment projects over the distal, ulnar aspect of the hamate, and there is also irregularity of the bases of the fourth and fifth metacarpals. There is no dislocation. IMPRESSION: 1. Mildly displaced and angulated fracture of the distal shaft of the fifth metacarpal. 2. Additional fractures of the hamate and possibly fourth and fifth metacarpal bases. Electronically Signed   By: Logan Bores M.D.   On: 11/12/2021 18:21    Procedures Procedures    Medications Ordered in ED Medications  oxyCODONE-acetaminophen (PERCOCET/ROXICET) 5-325 MG per tablet 1 tablet (1 tablet Oral Given 11/12/21 1733)  HYDROcodone-acetaminophen (NORCO/VICODIN) 5-325 MG per tablet 1 tablet (1 tablet Oral Given 11/12/21 1917)    ED Course/ Medical Decision Making/ A&P                           Medical Decision Making  Patient is a 36 year old male presenting today with a injury to the right hand.  He punched a wall prior to arrival.  Patient does have a small puncture versus abrasion to the fifth metacarpal area and significant swelling and deformity.  I evaluated and independently interpreted the x-ray which shows a fracture with angulation of the distal shaft of the fifth metatarsal as well as it appears fracture of the base of the fourth and fifth metatarsals and wrist fracture.  Patient is neurovascularly intact at this time.  Unclear whether this area on his hand is bleeding is a small puncture from the fracture or an abrasion from hitting the wall.  Will discuss with hand surgery.  Patient was placed in an ulnar gutter splint and after splint was applied he had less than 2-second capillary refill.  8:30 PM Spoke with Dr. Lenon Curt with hand surgery and will start pt on kelfex for possible open fracture and they will see him in the office tomorrow.  Plan discussed with the pt and he was stable for d/c.           Final Clinical  Impression(s) / ED Diagnoses Final diagnoses:  Closed displaced fracture of base of fourth metacarpal bone of right hand, initial encounter  Closed displaced fracture of hamate bone of right wrist, unspecified portion of hamate, initial encounter  Open displaced fracture of shaft of fifth metacarpal bone of right hand, initial encounter    Rx / DC Orders ED Discharge Orders  Ordered    HYDROcodone-acetaminophen (NORCO/VICODIN) 5-325 MG tablet  Every 6 hours PRN        11/12/21 2029    cephALEXin (KEFLEX) 500 MG capsule  4 times daily        11/12/21 2029              Blanchie Dessert, MD 11/12/21 2031

## 2021-11-12 NOTE — Discharge Instructions (Signed)
Leave the splint in place and don't get it wet.  See the specialist tomorrow.  Elevate to help with swelling and throbbing.

## 2021-12-18 ENCOUNTER — Ambulatory Visit: Payer: BC Managed Care – PPO | Admitting: Podiatry

## 2021-12-25 ENCOUNTER — Encounter: Payer: Self-pay | Admitting: Podiatry

## 2021-12-25 ENCOUNTER — Other Ambulatory Visit: Payer: Self-pay

## 2021-12-25 ENCOUNTER — Ambulatory Visit (INDEPENDENT_AMBULATORY_CARE_PROVIDER_SITE_OTHER): Payer: BC Managed Care – PPO | Admitting: Podiatry

## 2021-12-25 DIAGNOSIS — B351 Tinea unguium: Secondary | ICD-10-CM | POA: Diagnosis not present

## 2021-12-25 DIAGNOSIS — L600 Ingrowing nail: Secondary | ICD-10-CM

## 2021-12-25 MED ORDER — NEOMYCIN-POLYMYXIN-HC 3.5-10000-1 OT SUSP: OTIC | 0 refills | Status: DC

## 2021-12-25 MED ORDER — TERBINAFINE HCL 250 MG PO TABS: 250.0000 mg | ORAL_TABLET | Freq: Every day | ORAL | 0 refills | Status: AC

## 2021-12-25 NOTE — Progress Notes (Signed)
°  Subjective:  Patient ID: Bruce Shelton, male    DOB: 01/23/1986,  MRN: [id number]  Chief Complaint  Patient presents with   Nail Problem     (np) left foot great toe/2nd toe ingrown     36 y.o. male presents with the above complaint. History confirmed with patient.  He has had ingrown nails for some time worst of which is the outside of the great toe on the left foot and the inside of the second toe.  He also has some on the inside of the second toe on the right foot but is not as bothersome.  Notes thickening discolored ration of most of his toenails recently  Objective:  Physical Exam: warm, good capillary refill, no trophic changes or ulcerative lesions, normal DP and PT pulses, normal sensory exam, and onychomycosis. Left Foot: Ingrowing hallux nail lateral border ingrowing medial second toenail border, no paronychia either site Assessment:   1. Ingrowing right great toenail   2. Ingrowing left great toenail   3. Onychomycosis      Plan:  Patient was evaluated and treated and all questions answered.    Ingrown Nail, left -Patient elects to proceed with minor surgery to remove ingrown toenail today. Consent reviewed and signed by patient. -Ingrown nail excised. See procedure note. -Educated on post-procedure care including soaking. Written instructions provided and reviewed. -Patient to follow up in 2 weeks for nail check.  Procedure: Excision of Ingrown Toenail Location: Left first lateral, second medial nail borders. Anesthesia: Lidocaine 1% plain; 1.5 mL and Marcaine 0.5% plain; 1.5 mL, digital block. Skin Prep: Betadine. Dressing: Silvadene; telfa; dry, sterile, compression dressing. Technique: Following skin prep, the toe was exsanguinated and a tourniquet was secured at the base of the toe. The affected nail border was freed, split with a nail splitter, and excised. Chemical matrixectomy was then performed with phenol and irrigated out with alcohol. The tourniquet was  then removed and sterile dressing applied. Disposition: Patient tolerated procedure well. Patient to return in 2 weeks for follow-up.    He also has brown-yellow discoloration of multiple toenails up to suspect he has early onychomycosis.  I recommended treatment with Lamisil and I prescribed him a 90-day course this.  Use and possible side effects reviewed.  Return in about 2 weeks (around 01/08/2022) for nail re-check.

## 2021-12-25 NOTE — Patient Instructions (Signed)

## 2022-01-02 ENCOUNTER — Emergency Department (HOSPITAL_BASED_OUTPATIENT_CLINIC_OR_DEPARTMENT_OTHER): Payer: BC Managed Care – PPO | Admitting: Radiology

## 2022-01-02 ENCOUNTER — Encounter (HOSPITAL_BASED_OUTPATIENT_CLINIC_OR_DEPARTMENT_OTHER): Payer: Self-pay | Admitting: Urology

## 2022-01-02 ENCOUNTER — Other Ambulatory Visit: Payer: Self-pay

## 2022-01-02 ENCOUNTER — Emergency Department (HOSPITAL_BASED_OUTPATIENT_CLINIC_OR_DEPARTMENT_OTHER)
Admission: EM | Admit: 2022-01-02 | Discharge: 2022-01-02 | Disposition: A | Discharge status: Home / Self Care | Payer: BC Managed Care – PPO | Attending: Emergency Medicine | Admitting: Emergency Medicine

## 2022-01-02 DIAGNOSIS — J189 Pneumonia, unspecified organism: Secondary | ICD-10-CM

## 2022-01-02 DIAGNOSIS — J181 Lobar pneumonia, unspecified organism: Secondary | ICD-10-CM | POA: Insufficient documentation

## 2022-01-02 DIAGNOSIS — R091 Pleurisy: Secondary | ICD-10-CM | POA: Diagnosis not present

## 2022-01-02 DIAGNOSIS — R0789 Other chest pain: Secondary | ICD-10-CM | POA: Diagnosis present

## 2022-01-02 DIAGNOSIS — E876 Hypokalemia: Secondary | ICD-10-CM | POA: Diagnosis not present

## 2022-01-02 LAB — BASIC METABOLIC PANEL
Anion gap: 11 (ref 5–15)
BUN: 13 mg/dL (ref 6–20)
CO2: 26 mmol/L (ref 22–32)
Calcium: 9.8 mg/dL (ref 8.9–10.3)
Chloride: 101 mmol/L (ref 98–111)
Creatinine, Ser: 1.01 mg/dL (ref 0.61–1.24)
GFR, Estimated: >60 mL/min (ref >60)
Glucose, Bld: 110 mg/dL — ABNORMAL HIGH (ref 70–99)
Potassium: 3.3 mmol/L — ABNORMAL LOW (ref 3.5–5.1)
Sodium: 138 mmol/L (ref 135–145)

## 2022-01-02 LAB — CBC
HCT: 42.9 % (ref 39.0–52.0)
Hemoglobin: 14.2 g/dL (ref 13.0–17.0)
MCH: 28.8 pg (ref 26.0–34.0)
MCHC: 33.1 g/dL (ref 30.0–36.0)
MCV: 87 fL (ref 80.0–100.0)
Platelets: 212 10*3/uL (ref 150–400)
RBC: 4.93 MIL/uL (ref 4.22–5.81)
RDW: 13 % (ref 11.5–15.5)
WBC: 7.9 10*3/uL (ref 4.0–10.5)
nRBC: 0 % (ref 0.0–0.2)

## 2022-01-02 LAB — TROPONIN I (HIGH SENSITIVITY): Troponin I (High Sensitivity): 3 ng/L (ref <18)

## 2022-01-02 LAB — D-DIMER, QUANTITATIVE: D-Dimer, Quant: 0.36 ug/mL-FEU (ref 0.00–0.50)

## 2022-01-02 MED ORDER — NAPROXEN 500 MG PO TABS: 500.0000 mg | ORAL_TABLET | Freq: Two times a day (BID) | ORAL | 0 refills | Status: DC

## 2022-01-02 MED ORDER — DOXYCYCLINE HYCLATE 100 MG PO CAPS: 100.0000 mg | ORAL_CAPSULE | Freq: Two times a day (BID) | ORAL | 0 refills | Status: DC

## 2022-01-02 MED ORDER — KETOROLAC TROMETHAMINE 30 MG/ML IJ SOLN
30.0000 mg | Freq: Once | INTRAMUSCULAR | Status: AC
  Administered 2022-01-02: 30 mg via INTRAVENOUS
  Filled 2022-01-02: qty 1

## 2022-01-02 NOTE — ED Provider Notes (Signed)
[facility] Provider Note   CSN: [id number] Arrival date & time: 01/02/22  1049     History  Chief Complaint  Patient presents with   Chest Pain    Bruce Shelton is a 36 y.o. male.   Chest Pain  HPI: A 36 year old patient with a history of hypercholesterolemia presents for evaluation of chest pain. Initial onset of pain was approximately 3-6 hours ago. The patient's chest pain is sharp and is not worse with exertion. The patient's chest pain is middle- or left-sided, is not well-localized, is not described as heaviness/pressure/tightness and does not radiate to the arms/jaw/neck. The patient does not complain of nausea and denies diaphoresis. The patient has no history of stroke, has no history of peripheral artery disease, has not smoked in the past 90 days, denies any history of treated diabetes, has no relevant family history of coronary artery disease (first degree relative at less than age 28), is not hypertensive and does not have an elevated BMI (>=30).  Patient noticed the pain this morning.  It is a sharp pain that increases with deep breathing as well as moving around.  He has not had any issues with cough or shortness of breath.  No leg swelling.  No history of heart disease PE or DVT.  No prolonged trips or travel Home Medications Prior to Admission medications   Medication Sig Start Date End Date Taking? Authorizing Provider  doxycycline (VIBRAMYCIN) 100 MG capsule Take 1 capsule (100 mg total) by mouth 2 (two) times daily. 01/02/22  Yes Dorie Rank, MD  naproxen (NAPROSYN) 500 MG tablet Take 1 tablet (500 mg total) by mouth 2 (two) times daily with a meal. As needed for pain 01/02/22  Yes Dorie Rank, MD  cephALEXin (KEFLEX) 500 MG capsule Take 1 capsule (500 mg total) by mouth 4 (four) times daily. 11/12/21   Blanchie Dessert, MD  dicyclomine (BENTYL) 20 MG tablet Take 1 tablet (20 mg total) by mouth 2 (two) times daily. 03/04/20   Eustaquio Maize,  PA-C  HYDROcodone-acetaminophen (NORCO/VICODIN) 5-325 MG tablet Take 1 tablet by mouth every 6 (six) hours as needed for severe pain. 11/12/21   Blanchie Dessert, MD  loratadine (CLARITIN) 10 MG tablet Take 10 mg by mouth daily as needed for allergies.    [provider]  neomycin-polymyxin-hydrocortisone (CORTISPORIN) 3.5-10000-1 OTIC suspension Apply 1-2 drops daily after soaking and cover with bandaid 12/25/21   McDonald, Stephan Minister, DPM  ondansetron (ZOFRAN-ODT) 4 MG disintegrating tablet Take 4 mg by mouth every 8 (eight) hours as needed for nausea or vomiting.  12/27/19   [provider]  rosuvastatin (CRESTOR) 20 MG tablet Take 20 mg by mouth at bedtime. 11/28/21   [provider]  silver sulfADIAZINE (SILVADENE) 1 % cream Apply 1 application topically daily. Patient not taking: Reported on 03/04/2020 08/13/19   Ok Edwards, PA-C  terbinafine (LAMISIL) 250 MG tablet Take 1 tablet (250 mg total) by mouth daily. 12/25/21 03/25/22  McDonald, Stephan Minister, DPM  metoCLOPramide (REGLAN) 10 MG tablet Take 1 tablet (10 mg total) by mouth every 6 (six) hours as needed for nausea. 02/09/19 08/13/19  Cirigliano, Vito V, DO  prochlorperazine (COMPAZINE) 10 MG tablet Take 1 tablet (10 mg total) by mouth 2 (two) times daily as needed for nausea or vomiting. 02/08/19 08/13/19  Davonna Belling, MD      Allergies    Patient has no known allergies.    Review of Systems   Review of Systems  Cardiovascular:  Positive for chest pain.  All other systems reviewed and are negative.  Physical Exam Updated Vital Signs BP 111/83    Pulse 75    Temp 98.1 F (36.7 C) (Oral)    Resp 14    Ht 1.854 m (6\' 1" )    Wt 90.7 kg    SpO2 97%    BMI 26.39 kg/m  Physical Exam Vitals and nursing note reviewed.  Constitutional:      General: He is not in acute distress.    Appearance: He is well-developed.  HENT:     Head: Normocephalic and atraumatic.     Right Ear: External ear normal.     Left Ear: External  ear normal.  Eyes:     General: No scleral icterus.       Right eye: No discharge.        Left eye: No discharge.     Conjunctiva/sclera: Conjunctivae normal.  Neck:     Trachea: No tracheal deviation.  Cardiovascular:     Rate and Rhythm: Normal rate and regular rhythm.  Pulmonary:     Effort: Pulmonary effort is normal. No respiratory distress.     Breath sounds: Normal breath sounds. No stridor. No wheezing or rales.  Abdominal:     General: Bowel sounds are normal. There is no distension.     Palpations: Abdomen is soft.     Tenderness: There is no abdominal tenderness. There is no guarding or rebound.  Musculoskeletal:        General: No tenderness or deformity.     Cervical back: Neck supple.  Skin:    General: Skin is warm and dry.     Findings: No rash.  Neurological:     General: No focal deficit present.     Mental Status: He is alert.     Cranial Nerves: No cranial nerve deficit (no facial droop, extraocular movements intact, no slurred speech).     Sensory: No sensory deficit.     Motor: No abnormal muscle tone or seizure activity.     Coordination: Coordination normal.  Psychiatric:        Mood and Affect: Mood normal.    ED Results / Procedures / Treatments   Labs (all labs ordered are listed, but only abnormal results are displayed) Labs Reviewed  BASIC METABOLIC PANEL - Abnormal; Notable for the following components:      Result Value   Potassium 3.3 (*)    Glucose, Bld 110 (*)    All other components within normal limits  CBC  D-DIMER, QUANTITATIVE  TROPONIN I (HIGH SENSITIVITY)    EKG EKG Interpretation  Date/Time:  Wednesday January 02 2022 10:54:07 EST Ventricular Rate:  69 PR Interval:  172 QRS Duration: 93 QT Interval:  371 QTC Calculation: 398 R Axis:   75 Text Interpretation: Sinus rhythm Borderline repolarization abnormality No significant change since last tracing Confirmed by Dorie Rank [id number]) on 01/02/2022 10:55:52  AM  Radiology DG Chest 2 View  Result Date: 01/02/2022 CLINICAL DATA:  Chest pain, LEFT-sided chest pain is worse with movement. EXAM: CHEST - 2 VIEW COMPARISON:  November 09, 2010. FINDINGS: EKG leads project over the chest. Cardiomediastinal contours and hilar structures are normal. Possible small LEFT effusion with LEFT basilar airspace disease in the medial LEFT lung base also seen on lateral projection. No visible pneumothorax. On limited assessment there is no acute skeletal process. IMPRESSION: Findings suspicious for LEFT lower lobe pneumonia in the medial and posterior  LEFT chest potentially associated with small effusion. Electronically Signed   By: Zetta Bills M.D.   On: 01/02/2022 11:20    Procedures Procedures    Medications Ordered in ED Medications  ketorolac (TORADOL) 30 MG/ML injection 30 mg (30 mg Intravenous Given 01/02/22 1116)    ED Course/ Medical Decision Making/ A&P Clinical Course as of 01/02/22 1227  Wed Jan 02, 2022  1147 Chest x-ray images and radiology report reviewed, suspicious for left lower lobe pneumonia [JK]  1147 D-dimer, quantitative Normal [JK]  1147 Troponin I (High Sensitivity) Normal [JK]  123XX123 Basic metabolic panel(!) Mild hypokalemia [JK]  1147 CBC Normal [JK]    Clinical Course User Index [JK] Dorie Rank, MD   HEAR Score: 2                       Medical Decision Making Amount and/or Complexity of Data Reviewed Labs: ordered. Decision-making details documented in ED Course. Radiology: ordered.  Risk Prescription drug management.   Patient presented to ED with complaints of chest pain.  Symptoms atypical for cardiac etiology.  Patient overall low risk.  Hear score of 2.  Troponin and EKG are reassuring.  I doubt cardiac etiology.  Patient's pleuritic chest pain suggested pulmonary etiology.  No signs of pneumothorax on x-ray.  D-dimer is negative, low risk for PE.  Doubt pulmonary embolism.  Chest x-ray suggests possible left  lower lobe pneumonia.  Patient did not have much of an infectious prodrome however the chest x-ray finding does correlate with his symptoms.  It is possible he may also have pleurisy and atelectasis.  Will discharge home on course of oral antibiotics.  No indications for hospital admission at this time.  We will also prescribe a course of NSAIDs.  Warning signs precautions discussed.  Discussed follow-up with PCP to have repeat chest x-ray.        Final Clinical Impression(s) / ED Diagnoses Final diagnoses:  Pleurisy  Community acquired pneumonia of left lower lobe of lung    Rx / DC Orders ED Discharge Orders          Ordered    doxycycline (VIBRAMYCIN) 100 MG capsule  2 times daily        01/02/22 1225    naproxen (NAPROSYN) 500 MG tablet  2 times daily with meals        01/02/22 1225              Dorie Rank, MD 01/02/22 1228

## 2022-01-02 NOTE — ED Notes (Signed)
ED Provider at bedside. 

## 2022-01-02 NOTE — ED Notes (Signed)
Dc instructions reviewed with patient. Patient voiced understanding. Dc with belongings.  °

## 2022-01-02 NOTE — ED Triage Notes (Signed)
Left side chest pain  States SOB with pain  Worse with movement and deep breath Hx asthma  Took 2 puff of albuterol inhaler PTA A&O x 4

## 2022-01-02 NOTE — ED Notes (Signed)
Patient transported to X-ray 

## 2022-01-02 NOTE — Discharge Instructions (Addendum)
Take the antibiotics until complete.  Take the anti-inflammatory medication for your pain.  Follow-up with your primary care doctor to have a repeat chest x-ray.  Return to the ED if you start having fevers chills, shortness of breath or other concerning symptoms

## 2022-01-08 ENCOUNTER — Encounter: Payer: Self-pay | Admitting: Podiatry

## 2022-01-08 ENCOUNTER — Other Ambulatory Visit: Payer: Self-pay

## 2022-01-08 ENCOUNTER — Ambulatory Visit (INDEPENDENT_AMBULATORY_CARE_PROVIDER_SITE_OTHER): Payer: BC Managed Care – PPO | Admitting: Podiatry

## 2022-01-08 DIAGNOSIS — B351 Tinea unguium: Secondary | ICD-10-CM | POA: Diagnosis not present

## 2022-01-08 DIAGNOSIS — L600 Ingrowing nail: Secondary | ICD-10-CM

## 2022-01-08 NOTE — Progress Notes (Signed)
°  Subjective:  Patient ID: Bruce Shelton, male    DOB: Oct 24, 1986,  MRN: [id number]  Chief Complaint  Patient presents with   Ingrown Toenail       nail check, left foot    36 y.o. male presents with the above complaint. History confirmed with patient.  Doing well not having much pain he did not start taking the Lamisil yet  Objective:  Physical Exam: warm, good capillary refill, no trophic changes or ulcerative lesions, normal DP and PT pulses, normal sensory exam, and onychomycosis. Left Foot: Ingrowing hallux nail lateral border ingrowing medial second toenail border, no paronychia either site Assessment:   1. Ingrowing left great toenail   2. Onychomycosis      Plan:  Patient was evaluated and treated and all questions answered.    Ingrown Nail, left At this point he can discontinue soaks and ointment and leave open to air.  Advised will scab up and heal on its own at this point.  Has not started taking his Lamisil yet and I advised he may begin doing this.  I will see him back in 4 months for follow-up  Return in about 4 months (around 05/08/2022) for follow up after nail fungus treatment.

## 2022-03-27 ENCOUNTER — Encounter (HOSPITAL_BASED_OUTPATIENT_CLINIC_OR_DEPARTMENT_OTHER): Payer: Self-pay

## 2022-03-27 ENCOUNTER — Emergency Department (HOSPITAL_BASED_OUTPATIENT_CLINIC_OR_DEPARTMENT_OTHER): Payer: BC Managed Care – PPO

## 2022-03-27 ENCOUNTER — Emergency Department (HOSPITAL_BASED_OUTPATIENT_CLINIC_OR_DEPARTMENT_OTHER)
Admission: EM | Admit: 2022-03-27 | Discharge: 2022-03-27 | Disposition: A | Discharge status: Home / Self Care | Payer: BC Managed Care – PPO | Attending: Emergency Medicine | Admitting: Emergency Medicine

## 2022-03-27 ENCOUNTER — Other Ambulatory Visit: Payer: Self-pay

## 2022-03-27 DIAGNOSIS — J069 Acute upper respiratory infection, unspecified: Secondary | ICD-10-CM | POA: Diagnosis not present

## 2022-03-27 DIAGNOSIS — R059 Cough, unspecified: Secondary | ICD-10-CM | POA: Diagnosis present

## 2022-03-27 DIAGNOSIS — Z20822 Contact with and (suspected) exposure to covid-19: Secondary | ICD-10-CM | POA: Diagnosis not present

## 2022-03-27 DIAGNOSIS — R1110 Vomiting, unspecified: Secondary | ICD-10-CM | POA: Diagnosis not present

## 2022-03-27 LAB — CBC WITH DIFFERENTIAL/PLATELET
Abs Immature Granulocytes: 0.03 10*3/uL (ref 0.00–0.07)
Basophils Absolute: 0.1 10*3/uL (ref 0.0–0.1)
Basophils Relative: 1 %
Eosinophils Absolute: 0.1 10*3/uL (ref 0.0–0.5)
Eosinophils Relative: 1 %
HCT: 44.9 % (ref 39.0–52.0)
Hemoglobin: 15.2 g/dL (ref 13.0–17.0)
Immature Granulocytes: 0 %
Lymphocytes Relative: 15 %
Lymphs Abs: 1.5 10*3/uL (ref 0.7–4.0)
MCH: 29 pg (ref 26.0–34.0)
MCHC: 33.9 g/dL (ref 30.0–36.0)
MCV: 85.5 fL (ref 80.0–100.0)
Monocytes Absolute: 1.2 10*3/uL — ABNORMAL HIGH (ref 0.1–1.0)
Monocytes Relative: 12 %
Neutro Abs: 7.2 10*3/uL (ref 1.7–7.7)
Neutrophils Relative %: 71 %
Platelets: 212 10*3/uL (ref 150–400)
RBC: 5.25 MIL/uL (ref 4.22–5.81)
RDW: 13.2 % (ref 11.5–15.5)
WBC: 10.1 10*3/uL (ref 4.0–10.5)
nRBC: 0 % (ref 0.0–0.2)

## 2022-03-27 LAB — BASIC METABOLIC PANEL
Anion gap: 14 (ref 5–15)
BUN: 16 mg/dL (ref 6–20)
CO2: 23 mmol/L (ref 22–32)
Calcium: 9.8 mg/dL (ref 8.9–10.3)
Chloride: 100 mmol/L (ref 98–111)
Creatinine, Ser: 1.14 mg/dL (ref 0.61–1.24)
GFR, Estimated: >60 mL/min (ref >60)
Glucose, Bld: 108 mg/dL — ABNORMAL HIGH (ref 70–99)
Potassium: 3.8 mmol/L (ref 3.5–5.1)
Sodium: 137 mmol/L (ref 135–145)

## 2022-03-27 LAB — RESP PANEL BY RT-PCR (FLU A&B, COVID) ARPGX2
Influenza A by PCR: NEGATIVE
Influenza B by PCR: NEGATIVE
SARS Coronavirus 2 by RT PCR: NEGATIVE

## 2022-03-27 LAB — MAGNESIUM: Magnesium: 1.8 mg/dL (ref 1.7–2.4)

## 2022-03-27 MED ORDER — SODIUM CHLORIDE 0.9 % IV BOLUS
1000.0000 mL | Freq: Once | INTRAVENOUS | Status: AC
  Administered 2022-03-27: 1000 mL via INTRAVENOUS

## 2022-03-27 MED ORDER — IBUPROFEN 400 MG PO TABS
600.0000 mg | ORAL_TABLET | Freq: Once | ORAL | Status: AC
  Administered 2022-03-27: 600 mg via ORAL
  Filled 2022-03-27: qty 1

## 2022-03-27 MED ORDER — ACETAMINOPHEN 325 MG PO TABS
650.0000 mg | ORAL_TABLET | Freq: Once | ORAL | Status: AC
  Administered 2022-03-27: 650 mg via ORAL
  Filled 2022-03-27: qty 2

## 2022-03-27 MED ORDER — DOXYCYCLINE HYCLATE 100 MG PO CAPS: 100.0000 mg | ORAL_CAPSULE | Freq: Two times a day (BID) | ORAL | 0 refills | Status: DC

## 2022-03-27 MED ORDER — MAGNESIUM SULFATE 2 GM/50ML IV SOLN: 2.0000 g | Freq: Once | INTRAVENOUS | Status: DC

## 2022-03-27 MED ORDER — ONDANSETRON 4 MG PO TBDP: 4.0000 mg | ORAL_TABLET | Freq: Three times a day (TID) | ORAL | 0 refills | Status: DC | PRN

## 2022-03-27 MED ORDER — ONDANSETRON HCL 4 MG/2ML IJ SOLN
4.0000 mg | Freq: Once | INTRAMUSCULAR | Status: AC
  Administered 2022-03-27: 4 mg via INTRAVENOUS
  Filled 2022-03-27: qty 2

## 2022-03-27 NOTE — ED Provider Notes (Signed)
?[facility]PT ?Provider Note ? ? ?CSN: [id number] ?Arrival date & time: 03/27/22  1607 ? ?  ? ?History ? ?Chief Complaint  ?Patient presents with  ? Emesis  ? ? ?Bruce Shelton is a 36 y.o. male. ? ?Patient complaints of productive cough ongoing for 3 weeks.  Also has been noticing vomiting for a week as well, nonbloody nonbilious.  Denies any chest pain or abdominal pain.  Has had fevers for several days as well per patient subjective at home. ? ? ?  ? ?Home Medications ?Prior to Admission medications   ?Medication Sig Start Date End Date Taking? Authorizing Provider  ?doxycycline (VIBRAMYCIN) 100 MG capsule Take 1 capsule (100 mg total) by mouth 2 (two) times daily. 03/27/22  Yes Cheryll Cockayne, MD  ?ondansetron (ZOFRAN-ODT) 4 MG disintegrating tablet Take 1 tablet (4 mg total) by mouth every 8 (eight) hours as needed for nausea or vomiting. 03/27/22  Yes China, Eustace Moore, MD  ?cephALEXin (KEFLEX) 500 MG capsule Take 1 capsule (500 mg total) by mouth 4 (four) times daily. 11/12/21   Gwyneth Sprout, MD  ?dicyclomine (BENTYL) 20 MG tablet Take 1 tablet (20 mg total) by mouth 2 (two) times daily. 03/04/20   Tanda Rockers, PA-C  ?HYDROcodone-acetaminophen (NORCO/VICODIN) 5-325 MG tablet Take 1 tablet by mouth every 6 (six) hours as needed for severe pain. 11/12/21   Gwyneth Sprout, MD  ?loratadine (CLARITIN) 10 MG tablet Take 10 mg by mouth daily as needed for allergies.    [provider]  ?naproxen (NAPROSYN) 500 MG tablet Take 1 tablet (500 mg total) by mouth 2 (two) times daily with a meal. As needed for pain 01/02/22   Linwood Dibbles, MD  ?neomycin-polymyxin-hydrocortisone (CORTISPORIN) 3.5-10000-1 OTIC suspension Apply 1-2 drops daily after soaking and cover with bandaid 12/25/21   Edwin Cap, DPM  ?rosuvastatin (CRESTOR) 20 MG tablet Take 20 mg by mouth at bedtime. 11/28/21   [provider]  ?silver sulfADIAZINE (SILVADENE) 1 % cream Apply 1 application topically  daily. ?Patient not taking: Reported on 03/04/2020 08/13/19   Belinda Fisher, PA-C  ?metoCLOPramide (REGLAN) 10 MG tablet Take 1 tablet (10 mg total) by mouth every 6 (six) hours as needed for nausea. 02/09/19 08/13/19  Cirigliano, Verlin Dike, DO  ?prochlorperazine (COMPAZINE) 10 MG tablet Take 1 tablet (10 mg total) by mouth 2 (two) times daily as needed for nausea or vomiting. 02/08/19 08/13/19  Benjiman Core, MD  ?   ? ?Allergies    ?Patient has no known allergies.   ? ?Review of Systems   ?Review of Systems  ?Constitutional:  Positive for fever.  ?HENT:  Negative for ear pain and sore throat.   ?Eyes:  Negative for pain.  ?Respiratory:  Positive for cough.   ?Cardiovascular:  Negative for chest pain.  ?Gastrointestinal:  Negative for abdominal pain.  ?Genitourinary:  Negative for flank pain.  ?Musculoskeletal:  Negative for back pain.  ?Skin:  Negative for color change and rash.  ?Neurological:  Negative for syncope.  ?All other systems reviewed and are negative. ? ?Physical Exam ?Updated Vital Signs ?BP 115/81   Pulse 91   Temp (!) 100.9 ?F (38.3 ?C) (Oral)   Resp (!) 24   SpO2 98%  ?Physical Exam ?Constitutional:   ?   Appearance: He is well-developed.  ?HENT:  ?   Head: Normocephalic.  ?   Nose: Nose normal.  ?Eyes:  ?   Extraocular Movements: Extraocular movements intact.  ?Cardiovascular:  ?  Rate and Rhythm: Normal rate.  ?Pulmonary:  ?   Effort: Pulmonary effort is normal. No respiratory distress.  ?   Breath sounds: Normal breath sounds.  ?Abdominal:  ?   Tenderness: There is no abdominal tenderness. There is no right CVA tenderness, left CVA tenderness, guarding or rebound.  ?Skin: ?   Coloration: Skin is not jaundiced.  ?Neurological:  ?   Mental Status: He is alert. Mental status is at baseline.  ? ? ?ED Results / Procedures / Treatments   ?Labs ?(all labs ordered are listed, but only abnormal results are displayed) ?Labs Reviewed  ?CBC WITH DIFFERENTIAL/PLATELET - Abnormal; Notable for the following  components:  ?    Result Value  ? Monocytes Absolute 1.2 (*)   ? All other components within normal limits  ?BASIC METABOLIC PANEL - Abnormal; Notable for the following components:  ? Glucose, Bld 108 (*)   ? All other components within normal limits  ?RESP PANEL BY RT-PCR (FLU A&B, COVID) ARPGX2  ?MAGNESIUM  ? ? ?EKG ?None ? ?Radiology ?DG Chest Port 1 View ? ?Result Date: 03/27/2022 ?CLINICAL DATA:  Cough. 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 No consolidation. No visible pleural effusions or pneumothorax. Cardiomediastinal silhouette is within normal limits. No displaced fracture. IMPRESSION: 1. No evidence of acute cardiopulmonary disease. 2. Left cardiophrenic opacity correlates with Bochdalek's type hernia on prior CT abdomen/pelvis. Electronically Signed   By: Feliberto Harts M.D.   On: 03/27/2022 17:25   ? ?Procedures ?Procedures  ? ? ?Medications Ordered in ED ?Medications  ?acetaminophen (TYLENOL) tablet 650 mg (650 mg Oral Given 03/27/22 1728)  ?ibuprofen (ADVIL) tablet 600 mg (600 mg Oral Given 03/27/22 1728)  ?ondansetron [facility]) injection 4 mg (4 mg Intravenous Given 03/27/22 1728)  ?sodium chloride 0.9 % bolus 1,000 mL (0 mLs Intravenous Stopped 03/27/22 1959)  ? ? ?ED Course/ Medical Decision Making/ A&P ?  ?                        ?Medical Decision Making ?Amount and/or Complexity of Data Reviewed ?Labs: ordered. ?Radiology: ordered. ? ?Risk ?OTC drugs. ?Prescription drug management. ? ? ?Review of records shows a visit in February 2022 for pleurisy. ? ?Cardiac monitoring shows sinus tachycardia. ? ?Patient started on IV fluid resuscitation, labs ordered.  Tylenol provided and ibuprofen provided with Zofran. ? ?Vitals appear improved with resuscitation here in the ER.  COVID and influenza test are negative.  Chest x-ray otherwise unremarkable. ? ?We will give  prescription of antibiotics and nausea medicine to take at home.  Advised outpatient follow-up with his doctor within the week.  Advising immediate return for worsening symptoms or any additional concerns. ? ? ? ? ? ? ? ? ? ?Final Clinical Impression(s) / ED Diagnoses ?Final diagnoses:  ?Upper respiratory tract infection, unspecified type  ? ? ?Rx / DC Orders ?ED Discharge Orders   ? ?      Ordered  ?  doxycycline (VIBRAMYCIN) 100 MG capsule  2 times daily       ? 03/27/22 2008  ?  ondansetron (ZOFRAN-ODT) 4 MG disintegrating tablet  Every 8 hours PRN       ? 03/27/22 2008  ? ?  ?  ? ?  ? ? ?  ?Cheryll Cockayne, MD ?03/27/22 2008 ? ?

## 2022-03-27 NOTE — Discharge Instructions (Signed)
Call your primary care doctor or specialist as discussed in the next 2-3 days.   Return immediately back to the ER if:  Your symptoms worsen within the next 12-24 hours. You develop new symptoms such as new fevers, persistent vomiting, new pain, shortness of breath, or new weakness or numbness, or if you have any other concerns.  

## 2022-03-27 NOTE — ED Triage Notes (Signed)
C/o productive cough x 3 weeks. He c/o recent vomiting x 2 with persistent cough. He is in no distress. ?

## 2022-05-09 ENCOUNTER — Ambulatory Visit: Payer: BC Managed Care – PPO | Admitting: Podiatry

## 2022-10-21 ENCOUNTER — Encounter (HOSPITAL_BASED_OUTPATIENT_CLINIC_OR_DEPARTMENT_OTHER): Payer: Self-pay | Admitting: *Deleted

## 2022-10-21 ENCOUNTER — Emergency Department (HOSPITAL_BASED_OUTPATIENT_CLINIC_OR_DEPARTMENT_OTHER): Payer: BC Managed Care – PPO

## 2022-10-21 ENCOUNTER — Other Ambulatory Visit: Payer: Self-pay

## 2022-10-21 ENCOUNTER — Emergency Department (HOSPITAL_BASED_OUTPATIENT_CLINIC_OR_DEPARTMENT_OTHER)
Admission: EM | Admit: 2022-10-21 | Discharge: 2022-10-21 | Disposition: A | Discharge status: Home / Self Care | Payer: BC Managed Care – PPO | Attending: Emergency Medicine | Admitting: Emergency Medicine

## 2022-10-21 DIAGNOSIS — R1011 Right upper quadrant pain: Secondary | ICD-10-CM | POA: Diagnosis not present

## 2022-10-21 DIAGNOSIS — R1084 Generalized abdominal pain: Secondary | ICD-10-CM | POA: Insufficient documentation

## 2022-10-21 DIAGNOSIS — R112 Nausea with vomiting, unspecified: Secondary | ICD-10-CM | POA: Insufficient documentation

## 2022-10-21 LAB — CBC
HCT: 45.3 % (ref 39.0–52.0)
Hemoglobin: 15.6 g/dL (ref 13.0–17.0)
MCH: 29.5 pg (ref 26.0–34.0)
MCHC: 34.4 g/dL (ref 30.0–36.0)
MCV: 85.6 fL (ref 80.0–100.0)
Platelets: 219 10*3/uL (ref 150–400)
RBC: 5.29 MIL/uL (ref 4.22–5.81)
RDW: 13.3 % (ref 11.5–15.5)
WBC: 9.2 10*3/uL (ref 4.0–10.5)
nRBC: 0 % (ref 0.0–0.2)

## 2022-10-21 LAB — URINALYSIS, ROUTINE W REFLEX MICROSCOPIC
Bilirubin Urine: NEGATIVE
Glucose, UA: NEGATIVE mg/dL
Ketones, ur: NEGATIVE mg/dL
Leukocytes,Ua: NEGATIVE
Nitrite: NEGATIVE
Protein, ur: 30 mg/dL — AB
Specific Gravity, Urine: 1.028 (ref 1.005–1.030)
pH: 7 (ref 5.0–8.0)

## 2022-10-21 LAB — COMPREHENSIVE METABOLIC PANEL
ALT: 41 U/L (ref 0–44)
AST: 29 U/L (ref 15–41)
Albumin: 5 g/dL (ref 3.5–5.0)
Alkaline Phosphatase: 81 U/L (ref 38–126)
Anion gap: 15 (ref 5–15)
BUN: 13 mg/dL (ref 6–20)
CO2: 23 mmol/L (ref 22–32)
Calcium: 10 mg/dL (ref 8.9–10.3)
Chloride: 100 mmol/L (ref 98–111)
Creatinine, Ser: 1.1 mg/dL (ref 0.61–1.24)
GFR, Estimated: >60 mL/min (ref >60)
Glucose, Bld: 112 mg/dL — ABNORMAL HIGH (ref 70–99)
Potassium: 3.9 mmol/L (ref 3.5–5.1)
Sodium: 138 mmol/L (ref 135–145)
Total Bilirubin: 0.6 mg/dL (ref 0.3–1.2)
Total Protein: 8.9 g/dL — ABNORMAL HIGH (ref 6.5–8.1)

## 2022-10-21 LAB — LIPASE, BLOOD: Lipase: <10 U/L — ABNORMAL LOW (ref 11–51)

## 2022-10-21 MED ORDER — SODIUM CHLORIDE 0.9 % IV BOLUS
1000.0000 mL | Freq: Once | INTRAVENOUS | Status: AC
  Administered 2022-10-21: 1000 mL via INTRAVENOUS

## 2022-10-21 MED ORDER — ONDANSETRON HCL 4 MG/2ML IJ SOLN
4.0000 mg | Freq: Once | INTRAMUSCULAR | Status: AC
  Administered 2022-10-21: 4 mg via INTRAVENOUS
  Filled 2022-10-21: qty 2

## 2022-10-21 MED ORDER — HYDROMORPHONE HCL 1 MG/ML IJ SOLN
1.0000 mg | Freq: Once | INTRAMUSCULAR | Status: AC
  Administered 2022-10-21: 1 mg via INTRAVENOUS
  Filled 2022-10-21: qty 1

## 2022-10-21 MED ORDER — ONDANSETRON 4 MG PO TBDP: 4.0000 mg | ORAL_TABLET | Freq: Three times a day (TID) | ORAL | 0 refills | Status: AC | PRN

## 2022-10-21 MED ORDER — ONDANSETRON 4 MG PO TBDP
4.0000 mg | ORAL_TABLET | Freq: Once | ORAL | Status: AC | PRN
Start: 2022-10-21 — End: 2022-10-21
  Administered 2022-10-21: 4 mg via ORAL
  Filled 2022-10-21: qty 1

## 2022-10-21 NOTE — ED Notes (Signed)
Water given  

## 2022-10-21 NOTE — ED Triage Notes (Signed)
Pt reports that he has been vomiting "a lot" since 10:30pm.  Pt reports lower abdominal pain with this.  LBM a day ago. Pt took odt zofran pta without relief

## 2022-10-21 NOTE — ED Provider Notes (Signed)
[facility] EMERGENCY DEPT Provider Note  CSN: [id number] Arrival date & time: 10/21/22 0349 2 Chief Complaint(s) Vomiting  HPI Bruce Shelton is a 36 y.o. male with a past medical history listed below who presents to the emergency department with nausea and nonbloody nonbilious emesis with abdominal cramping that began earlier in the evening.  He reports that this began after eating a piece of steak.  States that he has had numerous bouts of emesis over the past 9 hours.  No associated fevers or chills.  No chest pain or shortness of breath.  Abdominal discomfort is generalized and nonfocal.  No diarrhea.  Still passing gas and having bowel movements.  No urinary symptoms.  The history is provided by the patient.    Past Medical History Past Medical History:  Diagnosis Date   GERD (gastroesophageal reflux disease)    Hemorrhoid    Hypokalemia    Patient Active Problem List   Diagnosis Date Noted   Intractable vomiting 12/16/2017   Cannabis abuse 12/16/2017   Dehydration 07/02/2015   AKI (acute kidney injury) (HCC) 07/02/2015   Hemorrhoid    Home Medication(s) Prior to Admission medications   Medication Sig Start Date End Date Taking? Authorizing Provider  ondansetron (ZOFRAN-ODT) 4 MG disintegrating tablet Take 1 tablet (4 mg total) by mouth every 8 (eight) hours as needed for up to 3 days for nausea or vomiting. 10/21/22 10/24/22 Yes Brodrick Curran, Amadeo Garnet, MD  cephALEXin (KEFLEX) 500 MG capsule Take 1 capsule (500 mg total) by mouth 4 (four) times daily. 11/12/21   Gwyneth Sprout, MD  dicyclomine (BENTYL) 20 MG tablet Take 1 tablet (20 mg total) by mouth 2 (two) times daily. 03/04/20   Hyman Hopes, Margaux, PA-C  doxycycline (VIBRAMYCIN) 100 MG capsule Take 1 capsule (100 mg total) by mouth 2 (two) times daily. 03/27/22   Cheryll Cockayne, MD  HYDROcodone-acetaminophen (NORCO/VICODIN) 5-325 MG tablet Take 1 tablet by mouth every 6 (six) hours as needed for severe pain.  11/12/21   Gwyneth Sprout, MD  loratadine (CLARITIN) 10 MG tablet Take 10 mg by mouth daily as needed for allergies.    [provider]  naproxen (NAPROSYN) 500 MG tablet Take 1 tablet (500 mg total) by mouth 2 (two) times daily with a meal. As needed for pain 01/02/22   Linwood Dibbles, MD  neomycin-polymyxin-hydrocortisone (CORTISPORIN) 3.5-10000-1 OTIC suspension Apply 1-2 drops daily after soaking and cover with bandaid 12/25/21   McDonald, Rachelle Hora, DPM  rosuvastatin (CRESTOR) 20 MG tablet Take 20 mg by mouth at bedtime. 11/28/21   [provider]  silver sulfADIAZINE (SILVADENE) 1 % cream Apply 1 application topically daily. Patient not taking: Reported on 03/04/2020 08/13/19   Belinda Fisher, PA-C  metoCLOPramide (REGLAN) 10 MG tablet Take 1 tablet (10 mg total) by mouth every 6 (six) hours as needed for nausea. 02/09/19 08/13/19  Cirigliano, Vito V, DO  prochlorperazine (COMPAZINE) 10 MG tablet Take 1 tablet (10 mg total) by mouth 2 (two) times daily as needed for nausea or vomiting. 02/08/19 08/13/19  Benjiman Core, MD  Allergies Patient has no known allergies.  Review of Systems Review of Systems As noted in HPI  Physical Exam Vital Signs  I have reviewed the triage vital signs BP 126/84 (BP Location: Right Arm)   Pulse 88   Temp 98.4 F (36.9 C) (Oral)   Resp 17   SpO2 96%   Physical Exam Vitals reviewed.  Constitutional:      General: He is not in acute distress.    Appearance: He is well-developed. He is not diaphoretic.  HENT:     Head: Normocephalic and atraumatic.     Right Ear: External ear normal.     Left Ear: External ear normal.     Nose: Nose normal.     Mouth/Throat:     Mouth: Mucous membranes are moist.  Eyes:     General: No scleral icterus.    Conjunctiva/sclera: Conjunctivae normal.  Neck:     Trachea: Phonation  normal.  Cardiovascular:     Rate and Rhythm: Normal rate and regular rhythm.  Pulmonary:     Effort: Pulmonary effort is normal. No respiratory distress.     Breath sounds: No stridor.  Abdominal:     General: There is no distension.     Tenderness: There is generalized abdominal tenderness (but mostly in RUQ and epigastrum).  Musculoskeletal:        General: Normal range of motion.     Cervical back: Normal range of motion.  Neurological:     Mental Status: He is alert and oriented to person, place, and time.  Psychiatric:        Behavior: Behavior normal.     ED Results and Treatments Labs (all labs ordered are listed, but only abnormal results are displayed) Labs Reviewed  LIPASE, BLOOD - Abnormal; Notable for the following components:      Result Value   Lipase <10 (*)    All other components within normal limits  COMPREHENSIVE METABOLIC PANEL - Abnormal; Notable for the following components:   Glucose, Bld 112 (*)    Total Protein 8.9 (*)    All other components within normal limits  URINALYSIS, ROUTINE W REFLEX MICROSCOPIC - Abnormal; Notable for the following components:   Hgb urine dipstick SMALL (*)    Protein, ur 30 (*)    All other components within normal limits  CBC                                                                                                                         EKG  EKG Interpretation  Date/Time:    Ventricular Rate:    PR Interval:    QRS Duration:   QT Interval:    QTC Calculation:   R Axis:     Text Interpretation:         Radiology US Abdomen Limited RUQ (LIVER/GB)  Result Date: 10/21/2022 CLINICAL DATA:  Right upper quadrant pain EXAM: ULTRASOUND ABDOMEN LIMITED RIGHT UPPER QUADRANT COMPARISON:  02/07/2019 FINDINGS: Gallbladder:  No gallstones or wall thickening visualized. No sonographic Murphy sign noted by sonographer. Common bile duct: Diameter: 4 mm Liver: Echogenic liver with some central sparing. No focal lesion.  Portal vein is patent on color Doppler imaging with normal direction of blood flow towards the liver. IMPRESSION: 1. No acute finding.  Negative gallbladder. 2. Hepatic steatosis. Electronically Signed   By: Tiburcio Pea M.D.   On: 10/21/2022 06:04    Medications Ordered in ED Medications  ondansetron (ZOFRAN-ODT) disintegrating tablet 4 mg (4 mg Oral Given 10/21/22 0415)  sodium chloride 0.9 % bolus 1,000 mL (0 mLs Intravenous Stopped 10/21/22 0645)  ondansetron (ZOFRAN) injection 4 mg (4 mg Intravenous Given 10/21/22 0457)  HYDROmorphone (DILAUDID) injection 1 mg (1 mg Intravenous Given 10/21/22 0457)                                                                                                                                     Procedures Procedures  (including critical care time)  Medical Decision Making / ED Course   Medical Decision Making Amount and/or Complexity of Data Reviewed Labs: ordered. Decision-making details documented in ED Course. Radiology: ordered and independent interpretation performed. Decision-making details documented in ED Course.  Risk Prescription drug management.    Postprandial abdominal pain with nausea and vomiting.  Generalized discomfort but mostly in the epigastrium and right upper quadrant.  Differential includes gastritis, pancreatitis, biliary disease, gastroenteritis, cannabinoid hyperemesis.  Less concerning for bowel obstruction versus intra-abdominal inflammatory/infectious processes but we will reconsider after initial workup was obtained.  CBC without leukocytosis or anemia.  CMP without significant electrolyte derangements or renal sufficiency.  No evidence of biliary obstruction or pancreatitis.  Right upper quadrant negative for gallstones or evidence of acute cholecystitis or biliary obstruction.  Patient was treated symptomatically with IV fluids, antiemetics and IV pain medicine.  Reported significant relief and able to tolerate  p.o.       Final Clinical Impression(s) / ED Diagnoses Final diagnoses:  Nausea and vomiting in adult   The patient appears reasonably screened and/or stabilized for discharge and I doubt any other medical condition or other [facility] requiring further screening, evaluation, or treatment in the ED at this time. I have discussed the findings, Dx and Tx plan with the patient/family who expressed understanding and agree(s) with the plan. Discharge instructions discussed at length. The patient/family was given strict return precautions who verbalized understanding of the instructions. No further questions at time of discharge.  Disposition: Discharge  Condition: Good  ED Discharge Orders          Ordered    ondansetron (ZOFRAN-ODT) 4 MG disintegrating tablet  Every 8 hours PRN        10/21/22 9563            Follow Up: Primary care provider  Call  to schedule an appointment for close follow up  This chart was dictated using voice recognition software.  Despite best efforts to proofread,  errors can occur which can change the documentation meaning.    Nira Conn, MD 10/21/22 [phone]

## 2022-12-26 ENCOUNTER — Emergency Department (HOSPITAL_BASED_OUTPATIENT_CLINIC_OR_DEPARTMENT_OTHER): Payer: BC Managed Care – PPO

## 2022-12-26 ENCOUNTER — Emergency Department (HOSPITAL_BASED_OUTPATIENT_CLINIC_OR_DEPARTMENT_OTHER)
Admission: EM | Admit: 2022-12-26 | Discharge: 2022-12-27 | Disposition: A | Discharge status: Home / Self Care | Payer: BC Managed Care – PPO | Attending: Emergency Medicine | Admitting: Emergency Medicine

## 2022-12-26 ENCOUNTER — Other Ambulatory Visit: Payer: Self-pay

## 2022-12-26 ENCOUNTER — Encounter (HOSPITAL_BASED_OUTPATIENT_CLINIC_OR_DEPARTMENT_OTHER): Payer: Self-pay

## 2022-12-26 DIAGNOSIS — Z1152 Encounter for screening for COVID-19: Secondary | ICD-10-CM | POA: Insufficient documentation

## 2022-12-26 DIAGNOSIS — R1011 Right upper quadrant pain: Secondary | ICD-10-CM

## 2022-12-26 DIAGNOSIS — Z79899 Other long term (current) drug therapy: Secondary | ICD-10-CM | POA: Diagnosis not present

## 2022-12-26 DIAGNOSIS — R112 Nausea with vomiting, unspecified: Secondary | ICD-10-CM | POA: Diagnosis present

## 2022-12-26 LAB — URINALYSIS, ROUTINE W REFLEX MICROSCOPIC
Bacteria, UA: NONE SEEN
Bilirubin Urine: NEGATIVE
Glucose, UA: NEGATIVE mg/dL
Hgb urine dipstick: NEGATIVE
Leukocytes,Ua: NEGATIVE
Nitrite: NEGATIVE
Protein, ur: 30 mg/dL — AB
Specific Gravity, Urine: 1.028 (ref 1.005–1.030)
pH: 8.5 — ABNORMAL HIGH (ref 5.0–8.0)

## 2022-12-26 LAB — RESP PANEL BY RT-PCR (RSV, FLU A&B, COVID)  RVPGX2
Influenza A by PCR: NEGATIVE
Influenza B by PCR: NEGATIVE
Resp Syncytial Virus by PCR: NEGATIVE
SARS Coronavirus 2 by RT PCR: NEGATIVE

## 2022-12-26 LAB — CBC
HCT: 47.3 % (ref 39.0–52.0)
Hemoglobin: 16.1 g/dL (ref 13.0–17.0)
MCH: 29.8 pg (ref 26.0–34.0)
MCHC: 34 g/dL (ref 30.0–36.0)
MCV: 87.4 fL (ref 80.0–100.0)
Platelets: 213 10*3/uL (ref 150–400)
RBC: 5.41 MIL/uL (ref 4.22–5.81)
RDW: 13.3 % (ref 11.5–15.5)
WBC: 14.6 10*3/uL — ABNORMAL HIGH (ref 4.0–10.5)
nRBC: 0 % (ref 0.0–0.2)

## 2022-12-26 LAB — COMPREHENSIVE METABOLIC PANEL
ALT: 40 U/L (ref 0–44)
AST: 24 U/L (ref 15–41)
Albumin: 5.1 g/dL — ABNORMAL HIGH (ref 3.5–5.0)
Alkaline Phosphatase: 77 U/L (ref 38–126)
Anion gap: 14 (ref 5–15)
BUN: 12 mg/dL (ref 6–20)
CO2: 23 mmol/L (ref 22–32)
Calcium: 10.7 mg/dL — ABNORMAL HIGH (ref 8.9–10.3)
Chloride: 101 mmol/L (ref 98–111)
Creatinine, Ser: 1.07 mg/dL (ref 0.61–1.24)
GFR, Estimated: >60 mL/min (ref >60)
Glucose, Bld: 116 mg/dL — ABNORMAL HIGH (ref 70–99)
Potassium: 3.8 mmol/L (ref 3.5–5.1)
Sodium: 138 mmol/L (ref 135–145)
Total Bilirubin: 0.8 mg/dL (ref 0.3–1.2)
Total Protein: 9.1 g/dL — ABNORMAL HIGH (ref 6.5–8.1)

## 2022-12-26 LAB — LIPASE, BLOOD: Lipase: <10 U/L — ABNORMAL LOW (ref 11–51)

## 2022-12-26 MED ORDER — CAPSAICIN 0.025 % EX CREA
TOPICAL_CREAM | CUTANEOUS | Status: AC
  Filled 2022-12-26: qty 60

## 2022-12-26 MED ORDER — DROPERIDOL 2.5 MG/ML IJ SOLN
1.2500 mg | Freq: Once | INTRAMUSCULAR | Status: AC
  Administered 2022-12-26: 1.25 mg via INTRAVENOUS
  Filled 2022-12-26: qty 2

## 2022-12-26 MED ORDER — LACTATED RINGERS IV BOLUS
1000.0000 mL | Freq: Once | INTRAVENOUS | Status: AC
  Administered 2022-12-26: 1000 mL via INTRAVENOUS

## 2022-12-26 MED ORDER — METOCLOPRAMIDE HCL 10 MG PO TABS: 10.0000 mg | ORAL_TABLET | Freq: Four times a day (QID) | ORAL | 0 refills | Status: DC

## 2022-12-26 NOTE — ED Provider Notes (Signed)
[facility] Provider Note   CSN: [id number] Arrival date & time: 12/26/22  1922     History  Chief Complaint  Patient presents with   Emesis    Afraz Hula is a 37 y.o. male.   Emesis  Patient is a 37 year old male with a past medical history significant for cannabis use, hemorrhoids, reflux, intractable vomiting  He states that he smoked marijuana heavily yesterday and states that today in the afternoon he began having nausea vomiting abdominal cramping.  He states it feels similar to episodes he has had in the past.  No chest pain difficulty breathing no hemoptysis lightheadedness or dizziness.  Denies any diarrhea, fever, recent antibiotics.       Home Medications Prior to Admission medications   Medication Sig Start Date End Date Taking? Authorizing Provider  metoCLOPramide (REGLAN) 10 MG tablet Take 1 tablet (10 mg total) by mouth every 6 (six) hours. 12/26/22  Yes Ladanian Kelter S, PA  cephALEXin (KEFLEX) 500 MG capsule Take 1 capsule (500 mg total) by mouth 4 (four) times daily. 11/12/21   Blanchie Dessert, MD  dicyclomine (BENTYL) 20 MG tablet Take 1 tablet (20 mg total) by mouth 2 (two) times daily. 03/04/20   Alroy Bailiff, Margaux, PA-C  doxycycline (VIBRAMYCIN) 100 MG capsule Take 1 capsule (100 mg total) by mouth 2 (two) times daily. 03/27/22   Luna Fuse, MD  HYDROcodone-acetaminophen (NORCO/VICODIN) 5-325 MG tablet Take 1 tablet by mouth every 6 (six) hours as needed for severe pain. 11/12/21   Blanchie Dessert, MD  loratadine (CLARITIN) 10 MG tablet Take 10 mg by mouth daily as needed for allergies.    [provider]  naproxen (NAPROSYN) 500 MG tablet Take 1 tablet (500 mg total) by mouth 2 (two) times daily with a meal. As needed for pain 01/02/22   Dorie Rank, MD  neomycin-polymyxin-hydrocortisone (CORTISPORIN) 3.5-10000-1 OTIC suspension Apply 1-2 drops daily after soaking and cover with bandaid 12/25/21    McDonald, Stephan Minister, DPM  rosuvastatin (CRESTOR) 20 MG tablet Take 20 mg by mouth at bedtime. 11/28/21   [provider]  silver sulfADIAZINE (SILVADENE) 1 % cream Apply 1 application topically daily. Patient not taking: Reported on 03/04/2020 08/13/19   Ok Edwards, PA-C  prochlorperazine (COMPAZINE) 10 MG tablet Take 1 tablet (10 mg total) by mouth 2 (two) times daily as needed for nausea or vomiting. 02/08/19 08/13/19  Davonna Belling, MD      Allergies    Patient has no known allergies.    Review of Systems   Review of Systems  Gastrointestinal:  Positive for vomiting.    Physical Exam Updated Vital Signs BP (!) 124/92 (BP Location: Right Arm)   Pulse 100   Temp 98.9 F (37.2 C) (Oral)   Resp 16   Ht 6' 1"$  (1.854 m)   Wt 90.7 kg   SpO2 100%   BMI 26.39 kg/m  Physical Exam Vitals and nursing note reviewed.  HENT:     Head: Normocephalic and atraumatic.     Nose: Nose normal.     Mouth/Throat:     Mouth: Mucous membranes are dry.  Eyes:     General: No scleral icterus. Cardiovascular:     Rate and Rhythm: Normal rate and regular rhythm.     Pulses: Normal pulses.     Heart sounds: Normal heart sounds.  Pulmonary:     Effort: Pulmonary effort is normal. No respiratory distress.     Breath  sounds: No wheezing.  Abdominal:     Palpations: Abdomen is soft.     Tenderness: There is abdominal tenderness.     Comments: Right upper quadrant tenderness No other abdominal tenderness.  No guarding or rebound  Musculoskeletal:     Cervical back: Normal range of motion.     Right lower leg: No edema.     Left lower leg: No edema.  Skin:    General: Skin is warm and dry.     Capillary Refill: Capillary refill takes less than 2 seconds.  Neurological:     Mental Status: He is alert. Mental status is at baseline.  Psychiatric:        Mood and Affect: Mood normal.        Behavior: Behavior normal.     ED Results / Procedures / Treatments   Labs (all labs ordered are  listed, but only abnormal results are displayed) Labs Reviewed  LIPASE, BLOOD - Abnormal; Notable for the following components:      Result Value   Lipase <10 (*)    All other components within normal limits  COMPREHENSIVE METABOLIC PANEL - Abnormal; Notable for the following components:   Glucose, Bld 116 (*)    Calcium 10.7 (*)    Total Protein 9.1 (*)    Albumin 5.1 (*)    All other components within normal limits  CBC - Abnormal; Notable for the following components:   WBC 14.6 (*)    All other components within normal limits  URINALYSIS, ROUTINE W REFLEX MICROSCOPIC - Abnormal; Notable for the following components:   pH 8.5 (*)    Ketones, ur TRACE (*)    Protein, ur 30 (*)    All other components within normal limits  RESP PANEL BY RT-PCR (RSV, FLU A&B, COVID)  [id number]    EKG None  Radiology US Abdomen Limited RUQ (LIVER/GB)  Result Date: 12/26/2022 CLINICAL DATA:  Right upper quadrant pain. EXAM: ULTRASOUND ABDOMEN LIMITED RIGHT UPPER QUADRANT COMPARISON:  October 21, 2022 FINDINGS: Gallbladder: No gallstones are identified. The gallbladder wall measures 4.3 mm in thickness. A positive sonographic Murphy's sign noted by sonographer. Common bile duct: Diameter: 3.7 mm Liver: No focal lesion identified. Diffusely increased echogenicity of the liver parenchyma is noted. Portal vein is patent on color Doppler imaging with normal direction of blood flow towards the liver. Other: None. IMPRESSION: 1. No evidence of cholelithiasis, with a positive sonographic Murphy's sign, which may represent sequelae associated with acalculous cholecystitis. Further evaluation with a nuclear medicine hepatobiliary scan is recommended. 2. Hepatic steatosis without focal liver lesions. Electronically Signed   By: Virgina Norfolk M.D.   On: 12/26/2022 23:13    Procedures Procedures    Medications Ordered in ED Medications  lactated ringers bolus 1,000 mL (1,000 mLs Intravenous New Bag/Given  12/26/22 2244)  droperidol (INAPSINE) 2.5 MG/ML injection 1.25 mg (1.25 mg Intravenous Given 12/26/22 2241)  capsaicin (ZOSTRIX) 0.025 % cream ( Topical Given 12/26/22 2245)    ED Course/ Medical Decision Making/ A&P Clinical Course as of 12/26/22 2353  Thu Dec 26, 2022  2342 Discussed with Dr. Randel Pigg of [facility] -- agrees with my plan to DC home w OP followup. No more THC.  [WF]    Clinical Course User Index [WF] Tedd Sias, [place]                             Medical Decision Making Amount  and/or Complexity of Data Reviewed Labs: ordered. Radiology: ordered.  Risk OTC drugs. Prescription drug management.    This patient presents to the ED for concern of abdominal pain nausea vomiting, this involves a number of treatment options, and is a complaint that carries with it a moderate to high risk of complications and morbidity. A differential diagnosis was considered for the patient's symptoms which is discussed below:   The causes of generalized abdominal pain include but are not limited to AAA, mesenteric ischemia, appendicitis, diverticulitis, DKA, gastritis, gastroenteritis, AMI, nephrolithiasis, pancreatitis, peritonitis, adrenal insufficiency,lead poisoning, iron toxicity, intestinal ischemia, constipation, UTI,SBO/LBO, splenic rupture, biliary disease, IBD, IBS, PUD, or hepatitis.   Co morbidities: Discussed in HPI   Brief History:  Patient is a 37 year old male with a past medical history significant for cannabis use, hemorrhoids, reflux, intractable vomiting  He states that he smoked marijuana heavily yesterday and states that today in the afternoon he began having nausea vomiting abdominal cramping.  He states it feels similar to episodes he has had in the past.  No chest pain difficulty breathing no hemoptysis lightheadedness or dizziness.  Denies any diarrhea, fever, recent antibiotics.    EMR reviewed including pt PMHx, past surgical history and past visits to ER.    See HPI for more details   Lab Tests:   I ordered and independently interpreted labs. Labs notable for Leukocytosis likely related to forceful emesis.  Lipase within normal limits no transaminitis.  COVID influenza RSV negative urinalysis unremarkable apart from ketones likely due to poor p.o. intake.  Imaging Studies:  NAD. I personally reviewed all imaging studies and no acute abnormality found. I agree with radiology interpretation.  No abnormal findings on ultrasound.  There was a sonographic Murphy sign.  I discussed with Dr. Randel Pigg of [facility] gastroenterology who agrees my plan to discharge home in the absence of concern for acute cholecystitis.  They will see an outpatient.  Cardiac Monitoring:  NA    Medicines ordered:  I ordered medication including droperidol, capsaicin, lactated Ringer's for nausea vomiting dehydration Reevaluation of the patient after these medicines showed that the patient improved I have reviewed the patients home medicines and have made adjustments as needed   Critical Interventions:     Consults/Attending Physician   I requested consultation with Randel Pigg,  and discussed lab and imaging findings as well as pertinent plan - they recommend: I will discharge patient home with follow-up with [facility] gastroenterology   Reevaluation:  After the interventions noted above I re-evaluated patient and found that they have :improved   Social Determinants of Health:      Problem List / ED Course:  Abdominal pain nausea vomiting symptoms completely resolved with droperidol and capsaicin cream and lactated Ringer's.  Will discharge home with Reglan and recommendations.  Smoke marijuana.  Follow-up with [facility] gastroenterology and return emergency room for any new or concerning symptoms.   Dispostion:  After consideration of the diagnostic results and the patients response to treatment, I feel that the patent would benefit from close  outpatient follow-up    Final Clinical Impression(s) / ED Diagnoses Final diagnoses:  Nausea and vomiting, unspecified vomiting type  RUQ abdominal pain    Rx / DC Orders ED Discharge Orders          Ordered    metoCLOPramide (REGLAN) 10 MG tablet  Every 6 hours        12/26/22 2353  Tedd Sias, [place] 12/26/22 2353    Fransico Meadow, MD 12/30/22 [phone]

## 2022-12-26 NOTE — ED Triage Notes (Signed)
Pt arrives POV from home with c/o abdominal pain, cramping, nausea, vomiting that began around 12:00 today.  Denies fevers or diarrhea.

## 2022-12-26 NOTE — Discharge Instructions (Signed)
Follow-up with [facility] gastroenterology have given you the information for their office.  Take Reglan as needed for nausea.

## 2022-12-27 NOTE — ED Notes (Signed)
Pt tolerated ginger ale.  

## 2022-12-27 NOTE — ED Notes (Signed)
RN reviewed discharge instructions with pt. Pt verbalized understanding and had no further questions. VSS upon discharge.  

## 2023-02-28 ENCOUNTER — Other Ambulatory Visit (HOSPITAL_BASED_OUTPATIENT_CLINIC_OR_DEPARTMENT_OTHER): Payer: Self-pay

## 2023-02-28 ENCOUNTER — Other Ambulatory Visit: Payer: Self-pay

## 2023-02-28 ENCOUNTER — Emergency Department (HOSPITAL_BASED_OUTPATIENT_CLINIC_OR_DEPARTMENT_OTHER)
Admission: EM | Admit: 2023-02-28 | Discharge: 2023-02-28 | Disposition: A | Discharge status: Home / Self Care | Payer: BC Managed Care – PPO | Attending: Emergency Medicine | Admitting: Emergency Medicine

## 2023-02-28 ENCOUNTER — Encounter (HOSPITAL_BASED_OUTPATIENT_CLINIC_OR_DEPARTMENT_OTHER): Payer: Self-pay | Admitting: Emergency Medicine

## 2023-02-28 DIAGNOSIS — R112 Nausea with vomiting, unspecified: Secondary | ICD-10-CM | POA: Diagnosis not present

## 2023-02-28 DIAGNOSIS — G43909 Migraine, unspecified, not intractable, without status migrainosus: Secondary | ICD-10-CM | POA: Diagnosis present

## 2023-02-28 LAB — COMPREHENSIVE METABOLIC PANEL
ALT: 49 U/L — ABNORMAL HIGH (ref 0–44)
AST: 32 U/L (ref 15–41)
Albumin: 4.8 g/dL (ref 3.5–5.0)
Alkaline Phosphatase: 84 U/L (ref 38–126)
Anion gap: 13 (ref 5–15)
BUN: 16 mg/dL (ref 6–20)
CO2: 25 mmol/L (ref 22–32)
Calcium: 10.6 mg/dL — ABNORMAL HIGH (ref 8.9–10.3)
Chloride: 102 mmol/L (ref 98–111)
Creatinine, Ser: 1.1 mg/dL (ref 0.61–1.24)
GFR, Estimated: >60 mL/min (ref >60)
Glucose, Bld: 144 mg/dL — ABNORMAL HIGH (ref 70–99)
Potassium: 3.7 mmol/L (ref 3.5–5.1)
Sodium: 140 mmol/L (ref 135–145)
Total Bilirubin: 0.5 mg/dL (ref 0.3–1.2)
Total Protein: 8.8 g/dL — ABNORMAL HIGH (ref 6.5–8.1)

## 2023-02-28 LAB — URINALYSIS, ROUTINE W REFLEX MICROSCOPIC
Bilirubin Urine: NEGATIVE
Glucose, UA: NEGATIVE mg/dL
Hgb urine dipstick: NEGATIVE
Ketones, ur: NEGATIVE mg/dL
Leukocytes,Ua: NEGATIVE
Nitrite: NEGATIVE
Specific Gravity, Urine: 1.028 (ref 1.005–1.030)
pH: 6.5 (ref 5.0–8.0)

## 2023-02-28 LAB — CBC WITH DIFFERENTIAL/PLATELET
Abs Immature Granulocytes: 0.09 10*3/uL — ABNORMAL HIGH (ref 0.00–0.07)
Basophils Absolute: 0.1 10*3/uL (ref 0.0–0.1)
Basophils Relative: 1 %
Eosinophils Absolute: 0.2 10*3/uL (ref 0.0–0.5)
Eosinophils Relative: 2 %
HCT: 47.7 % (ref 39.0–52.0)
Hemoglobin: 16 g/dL (ref 13.0–17.0)
Immature Granulocytes: 1 %
Lymphocytes Relative: 24 %
Lymphs Abs: 3.5 10*3/uL (ref 0.7–4.0)
MCH: 29.2 pg (ref 26.0–34.0)
MCHC: 33.5 g/dL (ref 30.0–36.0)
MCV: 87 fL (ref 80.0–100.0)
Monocytes Absolute: 0.9 10*3/uL (ref 0.1–1.0)
Monocytes Relative: 6 %
Neutro Abs: 9.6 10*3/uL — ABNORMAL HIGH (ref 1.7–7.7)
Neutrophils Relative %: 66 %
Platelets: 238 10*3/uL (ref 150–400)
RBC: 5.48 MIL/uL (ref 4.22–5.81)
RDW: 12.9 % (ref 11.5–15.5)
WBC: 14.3 10*3/uL — ABNORMAL HIGH (ref 4.0–10.5)
nRBC: 0 % (ref 0.0–0.2)

## 2023-02-28 LAB — LIPASE, BLOOD: Lipase: <10 U/L — ABNORMAL LOW (ref 11–51)

## 2023-02-28 MED ORDER — METOCLOPRAMIDE HCL 5 MG/ML IJ SOLN
10.0000 mg | Freq: Once | INTRAMUSCULAR | Status: AC
  Administered 2023-02-28: 10 mg via INTRAVENOUS
  Filled 2023-02-28: qty 2

## 2023-02-28 MED ORDER — DICYCLOMINE HCL 20 MG PO TABS
20.0000 mg | ORAL_TABLET | Freq: Two times a day (BID) | ORAL | 0 refills | Status: AC
  Filled 2023-02-28: qty 20, 10d supply, fill #0

## 2023-02-28 MED ORDER — SODIUM CHLORIDE 0.9 % IV BOLUS
1000.0000 mL | Freq: Once | INTRAVENOUS | Status: AC
  Administered 2023-02-28: 1000 mL via INTRAVENOUS

## 2023-02-28 MED ORDER — DIPHENHYDRAMINE HCL 50 MG/ML IJ SOLN
25.0000 mg | Freq: Once | INTRAMUSCULAR | Status: AC
  Administered 2023-02-28: 25 mg via INTRAVENOUS
  Filled 2023-02-28: qty 1

## 2023-02-28 MED ORDER — ONDANSETRON 4 MG PO TBDP
4.0000 mg | ORAL_TABLET | Freq: Three times a day (TID) | ORAL | 0 refills | Status: AC | PRN
  Filled 2023-02-28: qty 10, 4d supply, fill #0

## 2023-02-28 NOTE — ED Notes (Signed)
Pt is aware of the need for a sample... Unable to currently provide a sample.Marland KitchenMarland Kitchen

## 2023-02-28 NOTE — ED Notes (Signed)
Discharge paperwork given and verbally understood. 

## 2023-02-28 NOTE — ED Provider Notes (Signed)
[place] EMERGENCY DEPARTMENT AT [facility] Provider Note   CSN: [id number] Arrival date & time: 02/28/23  4098     History  Chief Complaint  Patient presents with   Migraine    Loretta Kluender is a 37 y.o. male.  Patient with repeated episodes of vomiting over the past several years presents to the emergency department for nausea and vomiting.  He states that he developed a "migraine" this morning.  This was described as a generalized headache which is now resolved.  He states that he took Excedrin for it.  Little bit later he began vomiting.  He has generalized abdominal pain.  No chest pain or shortness of breath.  No urinary symptoms.  No treatments prior to arrival.  Patient reports history of hernia surgery in the abdomen.  No confusion, weakness, numbness, or tingling in the arms of the legs.  No fever.  Past records reveal concern for cannabinoid hyperemesis syndrome.  Patient has had 2 right upper quadrant ultrasounds in the past year which have been negative.  He has had several abdominal CTs over the past several years.       Home Medications Prior to Admission medications   Medication Sig Start Date End Date Taking? Authorizing Provider  cephALEXin (KEFLEX) 500 MG capsule Take 1 capsule (500 mg total) by mouth 4 (four) times daily. 11/12/21   Gwyneth Sprout, MD  dicyclomine (BENTYL) 20 MG tablet Take 1 tablet (20 mg total) by mouth 2 (two) times daily. 03/04/20   Hyman Hopes, Margaux, PA-C  doxycycline (VIBRAMYCIN) 100 MG capsule Take 1 capsule (100 mg total) by mouth 2 (two) times daily. 03/27/22   Cheryll Cockayne, MD  HYDROcodone-acetaminophen (NORCO/VICODIN) 5-325 MG tablet Take 1 tablet by mouth every 6 (six) hours as needed for severe pain. 11/12/21   Gwyneth Sprout, MD  loratadine (CLARITIN) 10 MG tablet Take 10 mg by mouth daily as needed for allergies.    [provider]  metoCLOPramide (REGLAN) 10 MG tablet Take 1 tablet (10 mg total) by mouth every 6  (six) hours. 12/26/22   Gailen Shelter, PA  naproxen (NAPROSYN) 500 MG tablet Take 1 tablet (500 mg total) by mouth 2 (two) times daily with a meal. As needed for pain 01/02/22   Linwood Dibbles, MD  neomycin-polymyxin-hydrocortisone (CORTISPORIN) 3.5-10000-1 OTIC suspension Apply 1-2 drops daily after soaking and cover with bandaid 12/25/21   McDonald, Rachelle Hora, DPM  rosuvastatin (CRESTOR) 20 MG tablet Take 20 mg by mouth at bedtime. 11/28/21   [provider]  silver sulfADIAZINE (SILVADENE) 1 % cream Apply 1 application topically daily. Patient not taking: Reported on 03/04/2020 08/13/19   Belinda Fisher, PA-C  prochlorperazine (COMPAZINE) 10 MG tablet Take 1 tablet (10 mg total) by mouth 2 (two) times daily as needed for nausea or vomiting. 02/08/19 08/13/19  Benjiman Core, MD      Allergies    Patient has no known allergies.    Review of Systems   Review of Systems  Physical Exam Updated Vital Signs BP (!) 139/106 (BP Location: Right Arm)   Pulse 88   Temp 98.2 F (36.8 C) (Oral)   Resp 18   Ht  (1.854 m)   Wt 90.7 kg   SpO2 100%   BMI 26.39 kg/m   Physical Exam Vitals and nursing note reviewed.  Constitutional:      General: He is not in acute distress.    Appearance: He is well-developed.  HENT:  Head: Normocephalic and atraumatic.  Eyes:     General:        Right eye: No discharge.        Left eye: No discharge.     Conjunctiva/sclera: Conjunctivae normal.  Cardiovascular:     Rate and Rhythm: Normal rate and regular rhythm.     Heart sounds: Normal heart sounds.  Pulmonary:     Effort: Pulmonary effort is normal.     Breath sounds: Normal breath sounds.  Abdominal:     Palpations: Abdomen is soft.     Tenderness: There is abdominal tenderness.     Comments: General abd tenderness  Musculoskeletal:     Cervical back: Normal range of motion and neck supple.  Skin:    General: Skin is warm and dry.  Neurological:     Mental Status: He is alert.      ED Results / Procedures / Treatments   Labs (all labs ordered are listed, but only abnormal results are displayed) Labs Reviewed  CBC WITH DIFFERENTIAL/PLATELET - Abnormal; Notable for the following components:      Result Value   WBC 14.3 (*)    Neutro Abs 9.6 (*)    Abs Immature Granulocytes 0.09 (*)    All other components within normal limits  COMPREHENSIVE METABOLIC PANEL - Abnormal; Notable for the following components:   Glucose, Bld 144 (*)    Calcium 10.6 (*)    Total Protein 8.8 (*)    ALT 49 (*)    All other components within normal limits  LIPASE, BLOOD - Abnormal; Notable for the following components:   Lipase <10 (*)    All other components within normal limits  URINALYSIS, ROUTINE W REFLEX MICROSCOPIC - Abnormal; Notable for the following components:   Protein, ur TRACE (*)    All other components within normal limits    EKG None  Radiology No results found.  Procedures Procedures    Medications Ordered in ED Medications  metoCLOPramide (REGLAN) injection 10 mg (10 mg Intravenous Given 02/28/23 1018)  diphenhydrAMINE (BENADRYL) injection 25 mg (25 mg Intravenous Given 02/28/23 1019)  sodium chloride 0.9 % bolus 1,000 mL (0 mLs Intravenous Stopped 02/28/23 1046)  sodium chloride 0.9 % bolus 1,000 mL (0 mLs Intravenous Stopped 02/28/23 1342)    ED Course/ Medical Decision Making/ A&P    Patient seen and examined. History obtained directly from patient.   Labs/EKG: Ordered CBC, CMP, lipase, UA.  Imaging: Ordered none ordered, will consider imaging if symptoms or or intractable.  Medications/Fluids: Ordered: IV Reglan and Benadryl, IV fluid bolus.   Most recent vital signs reviewed and are as follows: BP (!) 139/106 (BP Location: Right Arm)   Pulse 88   Temp 98.2 F (36.8 C) (Oral)   Resp 18   Ht  (1.854 m)   Wt 90.7 kg   SpO2 100%   BMI 26.39 kg/m   Initial impression: Nausea and vomiting, likely element of hyperemesis.  Patient  with headache this morning, however now resolved.  No neuro deficits, no red flags.  3:28 PM Reassessment performed. Patient appears stable over the course of several rechecks.  No further vomiting.  Patient has been hydrated 2 L IV.   Labs personally reviewed and interpreted including: CBC with elevated white blood cell count of 14.3 suspect reactive the vomiting otherwise unremarkable; CMP electrolytes, kidney function and liver function normal; lipase normal; UA without signs of infection.  Imaging personally visualized and interpreted including: None ordered.  Consider CT imaging, however patient has clinical history of similar symptoms and has improved with antiemetics and IV fluids in the ED.  Reviewed pertinent lab work and imaging with patient at bedside. Questions answered.   Most current vital signs reviewed and are as follows: BP 120/73 (BP Location: Right Arm)   Pulse 71   Temp 98.2 F (36.8 C) (Oral)   Resp 16   Ht 6\' 1"  (1.854 m)   Wt 90.7 kg   SpO2 96%   BMI 26.39 kg/m   Plan: Discharge to home.   Prescriptions written for: Zofran, Bentyl  Other home care instructions discussed: Scheduled nausea medication for the next 24 hours, clear liquid diet for the next 24 hours and then slow advancement to brat diet.  ED return instructions discussed: The patient was urged to return to the Emergency Department immediately with worsening of current symptoms, worsening abdominal pain, persistent vomiting, blood noted in stools, fever, or any other concerns. The patient verbalized understanding.   Follow-up instructions discussed: Patient encouraged to follow-up with their PCP in 3 days.  He does report that he has an upcoming gastroenterology appointment.                           Medical Decision Making Amount and/or Complexity of Data Reviewed Labs: ordered.  Risk Prescription drug management.   For this patient's complaint of abdominal pain, the following conditions  were considered on the differential diagnosis: gastritis/PUD, enteritis/duodenitis, appendicitis, cholelithiasis/cholecystitis, cholangitis, pancreatitis, ruptured viscus, colitis, diverticulitis, small/large bowel obstruction, proctitis, cystitis, pyelonephritis, ureteral colic, aortic dissection, aortic aneurysm. Atypical chest etiologies were also considered including ACS, PE, and pneumonia.  The patient's vital signs, pertinent lab work and imaging were reviewed and interpreted as discussed in the ED course. Hospitalization was considered for further testing, treatments, or serial exams/observation. However as patient is well-appearing, has a stable exam, and reassuring studies today, I do not feel that they warrant admission at this time. This plan was discussed with the patient who verbalizes agreement and comfort with this plan and seems reliable and able to return to the Emergency Department with worsening or changing symptoms.           Final Clinical Impression(s) / ED Diagnoses Final diagnoses:  Nausea vomiting and diarrhea    Rx / DC Orders ED Discharge Orders          Ordered    ondansetron (ZOFRAN-ODT) 4 MG disintegrating tablet  Every 8 hours PRN        02/28/23 1526    dicyclomine (BENTYL) 20 MG tablet  2 times daily        02/28/23 1526              Renne Crigler, PA-C 02/28/23 1530    Arby Barrette, MD 03/05/23 1430

## 2023-02-28 NOTE — Discharge Instructions (Signed)
Please read and follow all provided instructions.  Your diagnoses today include:  1. Nausea vomiting and diarrhea     Tests performed today include: Blood cell counts and platelets: high white blood cells Kidney and liver function tests Pancreas function test (called lipase) Urine test to look for infection Vital signs. See below for your results today.   Medications prescribed:  Zofran (ondansetron) - for nausea and vomiting  Bentyl - medication for intestinal cramps and spasms  Take any prescribed medications only as directed.  Home care instructions:  Follow any educational materials contained in this packet.  Follow-up instructions: Please follow-up with your primary care provider in the next 3 days for further evaluation of your symptoms.    Return instructions:  SEEK IMMEDIATE MEDICAL ATTENTION IF: The pain does not go away or becomes severe  A temperature above 101F develops  Repeated vomiting occurs (multiple episodes)  The pain becomes localized to portions of the abdomen. The right side could possibly be appendicitis. In an adult, the left lower portion of the abdomen could be colitis or diverticulitis.  Blood is being passed in stools or vomit (bright red or black tarry stools)  You develop chest pain, difficulty breathing, dizziness or fainting, or become confused, poorly responsive, or inconsolable (young children) If you have any other emergent concerns regarding your health  Additional Information: Abdominal (belly) pain can be caused by many things. Your caregiver performed an examination and possibly ordered blood/urine tests and imaging (CT scan, x-rays, ultrasound). Many cases can be observed and treated at home after initial evaluation in the emergency department. Even though you are being discharged home, abdominal pain can be unpredictable. Therefore, you need a repeated exam if your pain does not resolve, returns, or worsens. Most patients with abdominal  pain don't have to be admitted to the hospital or have surgery, but serious problems like appendicitis and gallbladder attacks can start out as nonspecific pain. Many abdominal conditions cannot be diagnosed in one visit, so follow-up evaluations are very important.  Your vital signs today were: BP 120/73 (BP Location: Right Arm)   Pulse 71   Temp 98.2 F (36.8 C) (Oral)   Resp 16   Ht  (1.854 m)   Wt 90.7 kg   SpO2 96%   BMI 26.39 kg/m  If your blood pressure (bp) was elevated above 135/85 this visit, please have this repeated by your doctor within one month. --------------

## 2023-02-28 NOTE — ED Triage Notes (Signed)
Pt via pov from home with migraine that started this morning, leading to emesis. Pt states he periodically gets migraines and that emesis is common with them. Pt alert & oriented, moaning in triage.

## 2023-04-10 ENCOUNTER — Other Ambulatory Visit (HOSPITAL_COMMUNITY): Payer: Self-pay | Admitting: Internal Medicine

## 2023-04-10 DIAGNOSIS — R112 Nausea with vomiting, unspecified: Secondary | ICD-10-CM

## 2023-04-22 ENCOUNTER — Ambulatory Visit (HOSPITAL_COMMUNITY)
Admission: RE | Admit: 2023-04-22 | Discharge: 2023-04-22 | Disposition: A | Discharge status: Home / Self Care | Payer: BC Managed Care – PPO | Source: Ambulatory Visit | Attending: Internal Medicine | Admitting: Internal Medicine

## 2023-04-22 DIAGNOSIS — R112 Nausea with vomiting, unspecified: Secondary | ICD-10-CM | POA: Diagnosis not present

## 2023-04-22 MED ORDER — TECHNETIUM TC 99M MEBROFENIN IV KIT
5.4000 | PACK | Freq: Once | INTRAVENOUS | Status: AC | PRN
  Administered 2023-04-22: 5.4 via INTRAVENOUS

## 2023-11-13 IMAGING — DX DG HAND COMPLETE 3+V*R*
3 series · 3 of 3 positions shown · non-contrast
Comparison: None.

CLINICAL DATA: Right hand swelling and pain after punching a wall.

EXAM:
RIGHT HAND - COMPLETE 3+ VIEW

[hand obl]
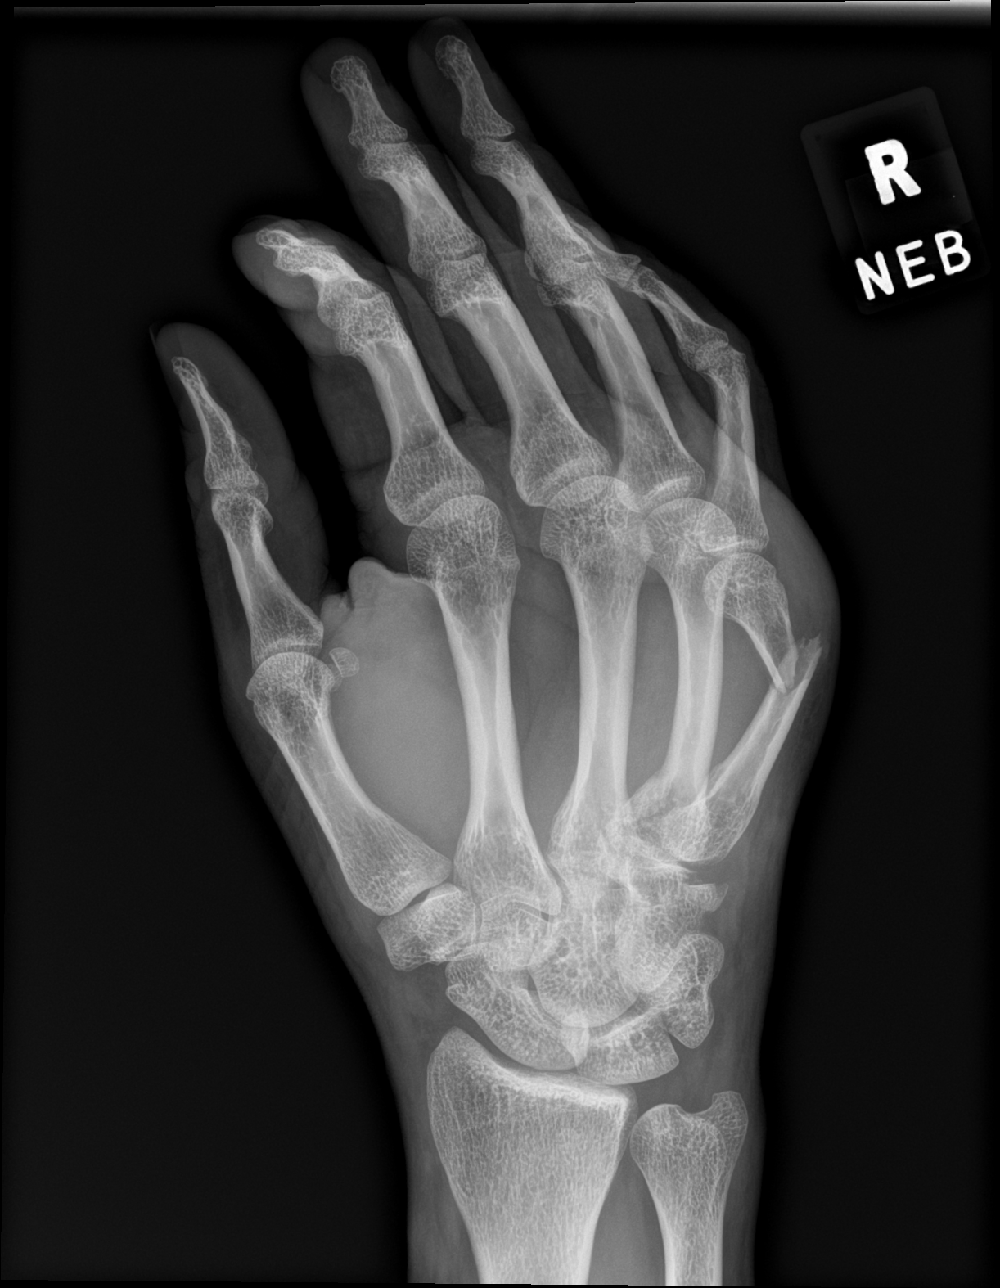

[hand lat]
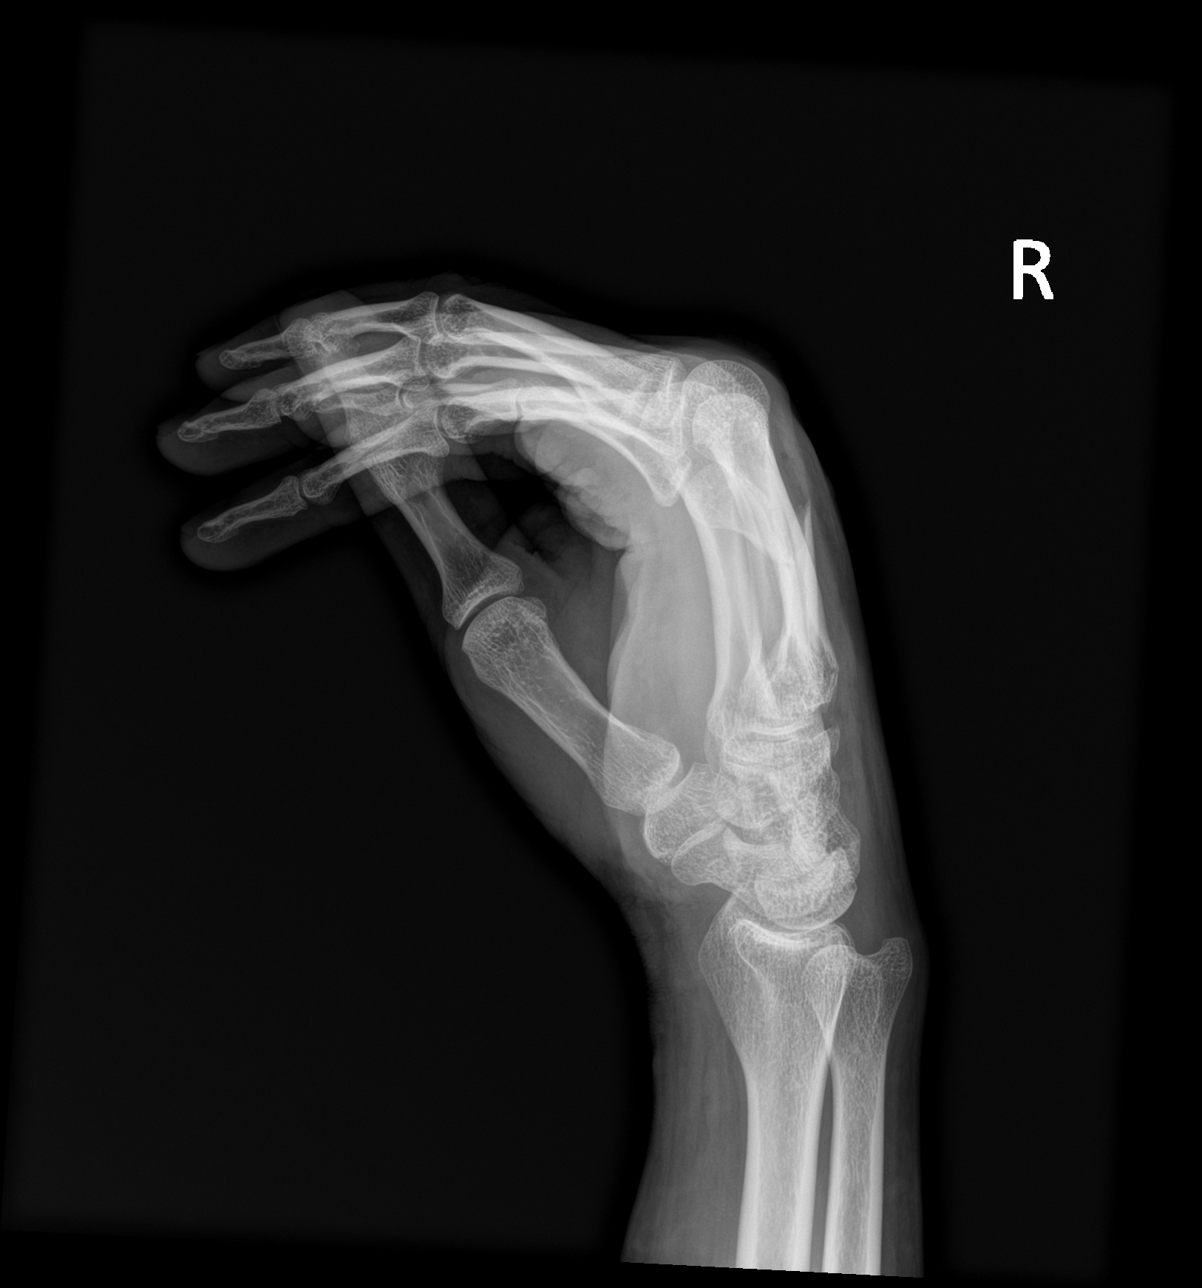

[hand ap]
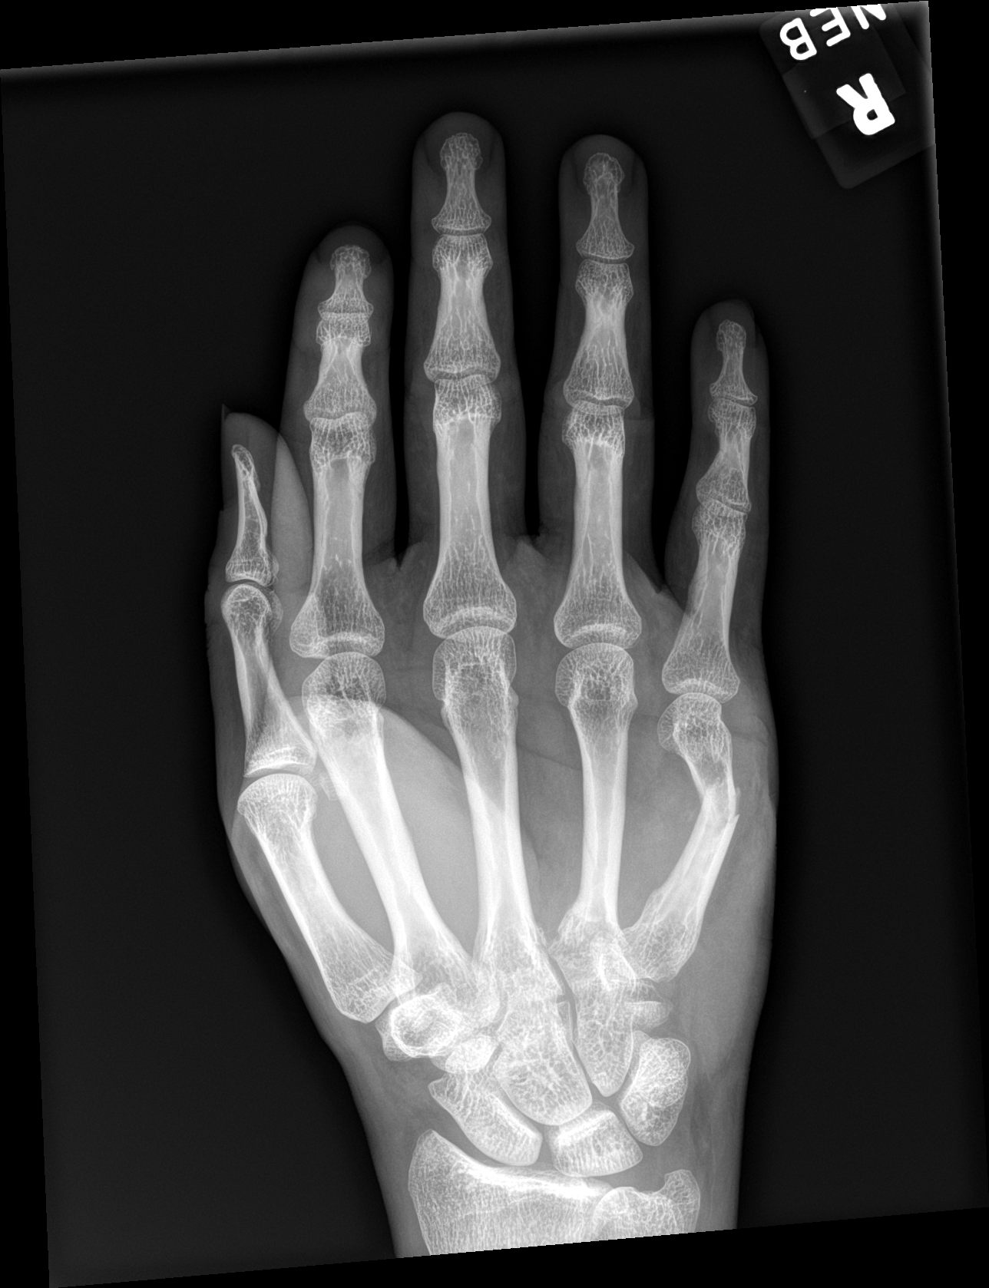

[3 of 3 positions shown; findings below may reference images not displayed]

FINDINGS: There is an oblique, mildly displaced and volarly angulated fracture
of the distal shaft of the fifth metacarpal with overlying soft
tissue swelling. A bone fragment projects over the distal, ulnar
aspect of the hamate, and there is also irregularity of the bases of
the fourth and fifth metacarpals. There is no dislocation.
IMPRESSION: 1. Mildly displaced and angulated fracture of the distal shaft of
the fifth metacarpal.
2. Additional fractures of the hamate and possibly fourth and fifth
metacarpal bases.

## 2023-11-20 ENCOUNTER — Ambulatory Visit (INDEPENDENT_AMBULATORY_CARE_PROVIDER_SITE_OTHER): Payer: BC Managed Care – PPO | Admitting: Podiatry

## 2023-11-20 ENCOUNTER — Encounter: Payer: Self-pay | Admitting: Podiatry

## 2023-11-20 DIAGNOSIS — L600 Ingrowing nail: Secondary | ICD-10-CM | POA: Diagnosis not present

## 2023-11-20 NOTE — Patient Instructions (Signed)
 Place 1/4 cup of epsom salts in a quart of warm tap water.  Submerge your foot or feet in the solution and soak for 20 minutes.  This soak should be done twice a day.  Next, remove your foot or feet from solution, blot dry the affected area. Apply ointment and cover if instructed by your doctor.   IF YOUR SKIN BECOMES IRRITATED WHILE USING THESE INSTRUCTIONS, IT IS OKAY TO SWITCH TO  WHITE VINEGAR AND WATER.  As another alternative soak, you may use antibacterial soap and water.  Monitor for any signs/symptoms of infection. Call the office immediately if any occur or go directly to the emergency room. Call with any questions/concerns.   Recommend taking Tylenol  and ibuprofen  every 4-6 hours to help with pain.  Take this when you get home.  May stagger the medications so that you are taking the medication every 2-3 hours.

## 2023-11-20 NOTE — Progress Notes (Signed)
       Subjective:  Patient ID: Bruce Shelton, male    DOB: 07-14-1986,  MRN: [id number]  Bruce Shelton presents to clinic today for:  Chief Complaint  Patient presents with   Nail Problem    Patient will like for his toe nails to be cut down , patient states his left hallux nail is killing him . No medication for pain   Patient presenting with concern for ingrowing toenail left hallux medial border.  He has had ingrown toenails previously affecting the opposite border.  Does also state that the second toenail right foot does get irritated at times  PCP is Patient, No Pcp Per.  No Known Allergies  Review of Systems: Negative except as noted in the HPI.  Objective:  There were no vitals filed for this visit.  Bruce Shelton is a pleasant 38 y.o. male in NAD. AAO x 3.  Vascular Examination: Capillary refill time is less than 3 seconds to toes bilateral. Palpable pedal pulses b/l LE. Digital hair present b/l. No pedal edema b/l. Skin temperature gradient WNL b/l. No varicosities b/l. No cyanosis or clubbing noted b/l.   Dermatological Examination: There is incurvation of the left hallux medial nail border.  There is pain on palpation of the affected nail border.  Mild edema present, no drainage.  No erythema.  Neurological Examination: Protective sensation intact with Semmes-Weinstein 10 gram monofilament b/l LE. Vibratory sensation intact b/l LE.      No data to display           Assessment/Plan: 1. Ingrowing left great toenail     No orders of the defined types were placed in this encounter.   Discussed patient's condition today.  After obtaining patient consent, the left hallux was anesthetized with a 50:50 mixture of 1% lidocaine  plain and 0.5% bupivacaine  plain for a total of 3cc's administered.  Upon confirmation of anesthesia, a freer elevator was utilized to free the medial nail border from the nail bed.  The nail border was then avulsed proximal to the eponychium and  removed in toto.  The area was inspected for any remaining spicules.  A chemical matrixectomy was performed with NaOH and neutralized with acetic acid solution.  Antibiotic ointment and a DSD were applied, followed by a Coban dressing.  Patient tolerated the anesthetic and procedure well and will f/u in 2-3 weeks for recheck.  Patient given post-procedure instructions for daily 15-minute Epsom salt soaks, antibiotic ointment and daily use of Bandaids until toe starts to dry / form eschar.    Return in about 2 weeks (around 12/04/2023) for Nail Check.   Dontarious Schaum L. Lamount MAUL, AACFAS [facility]     [address] [place], [place] [address]                Office [phone]  Fax [phone]

## 2023-12-04 ENCOUNTER — Emergency Department (HOSPITAL_BASED_OUTPATIENT_CLINIC_OR_DEPARTMENT_OTHER)
Admission: EM | Admit: 2023-12-04 | Discharge: 2023-12-04 | Discharge status: Against Medical Advice | Payer: BC Managed Care – PPO | Attending: Emergency Medicine | Admitting: Emergency Medicine

## 2023-12-04 ENCOUNTER — Other Ambulatory Visit: Payer: Self-pay

## 2023-12-04 DIAGNOSIS — R1110 Vomiting, unspecified: Secondary | ICD-10-CM | POA: Insufficient documentation

## 2023-12-04 DIAGNOSIS — Z5321 Procedure and treatment not carried out due to patient leaving prior to being seen by health care provider: Secondary | ICD-10-CM | POA: Diagnosis not present

## 2023-12-04 DIAGNOSIS — M791 Myalgia, unspecified site: Secondary | ICD-10-CM | POA: Diagnosis not present

## 2023-12-04 DIAGNOSIS — Z20822 Contact with and (suspected) exposure to covid-19: Secondary | ICD-10-CM | POA: Diagnosis not present

## 2023-12-04 LAB — CBC
HCT: 48.1 % (ref 39.0–52.0)
Hemoglobin: 16.5 g/dL (ref 13.0–17.0)
MCH: 30 pg (ref 26.0–34.0)
MCHC: 34.3 g/dL (ref 30.0–36.0)
MCV: 87.5 fL (ref 80.0–100.0)
Platelets: 218 10*3/uL (ref 150–400)
RBC: 5.5 MIL/uL (ref 4.22–5.81)
RDW: 13.1 % (ref 11.5–15.5)
WBC: 8.4 10*3/uL (ref 4.0–10.5)
nRBC: 0 % (ref 0.0–0.2)

## 2023-12-04 LAB — RESP PANEL BY RT-PCR (RSV, FLU A&B, COVID)  RVPGX2
Influenza A by PCR: POSITIVE — AB
Influenza B by PCR: NEGATIVE
Resp Syncytial Virus by PCR: NEGATIVE
SARS Coronavirus 2 by RT PCR: NEGATIVE

## 2023-12-04 LAB — COMPREHENSIVE METABOLIC PANEL
ALT: 33 U/L (ref 0–44)
AST: 25 U/L (ref 15–41)
Albumin: 5.1 g/dL — ABNORMAL HIGH (ref 3.5–5.0)
Alkaline Phosphatase: 79 U/L (ref 38–126)
Anion gap: 16 — ABNORMAL HIGH (ref 5–15)
BUN: 15 mg/dL (ref 6–20)
CO2: 21 mmol/L — ABNORMAL LOW (ref 22–32)
Calcium: 10.4 mg/dL — ABNORMAL HIGH (ref 8.9–10.3)
Chloride: 103 mmol/L (ref 98–111)
Creatinine, Ser: 1.1 mg/dL (ref 0.61–1.24)
GFR, Estimated: >60 mL/min (ref >60)
Glucose, Bld: 115 mg/dL — ABNORMAL HIGH (ref 70–99)
Potassium: 3.4 mmol/L — ABNORMAL LOW (ref 3.5–5.1)
Sodium: 140 mmol/L (ref 135–145)
Total Bilirubin: 0.8 mg/dL (ref 0.0–1.2)
Total Protein: 8.4 g/dL — ABNORMAL HIGH (ref 6.5–8.1)

## 2023-12-04 LAB — GROUP A STREP BY PCR: Group A Strep by PCR: NOT DETECTED

## 2023-12-04 LAB — LIPASE, BLOOD: Lipase: <10 U/L — ABNORMAL LOW (ref 11–51)

## 2023-12-04 MED ORDER — ONDANSETRON 4 MG PO TBDP
4.0000 mg | ORAL_TABLET | Freq: Once | ORAL | Status: AC | PRN
  Administered 2023-12-04: 4 mg via ORAL
  Filled 2023-12-04: qty 1

## 2023-12-04 NOTE — ED Triage Notes (Signed)
Emesis x1 day. Body aches.

## 2023-12-05 ENCOUNTER — Encounter (HOSPITAL_COMMUNITY): Payer: Self-pay | Admitting: Emergency Medicine

## 2023-12-05 ENCOUNTER — Emergency Department (HOSPITAL_COMMUNITY)
Admission: EM | Admit: 2023-12-05 | Discharge: 2023-12-05 | Disposition: A | Discharge status: Home / Self Care | Payer: BC Managed Care – PPO | Attending: Emergency Medicine | Admitting: Emergency Medicine

## 2023-12-05 DIAGNOSIS — R112 Nausea with vomiting, unspecified: Secondary | ICD-10-CM | POA: Insufficient documentation

## 2023-12-05 DIAGNOSIS — Z79899 Other long term (current) drug therapy: Secondary | ICD-10-CM | POA: Insufficient documentation

## 2023-12-05 DIAGNOSIS — F1290 Cannabis use, unspecified, uncomplicated: Secondary | ICD-10-CM

## 2023-12-05 LAB — RAPID URINE DRUG SCREEN, HOSP PERFORMED
Amphetamines: NOT DETECTED
Barbiturates: NOT DETECTED
Benzodiazepines: NOT DETECTED
Cocaine: NOT DETECTED
Opiates: NOT DETECTED
Tetrahydrocannabinol: POSITIVE — AB

## 2023-12-05 MED ORDER — ONDANSETRON HCL 4 MG PO TABS: 4.0000 mg | ORAL_TABLET | Freq: Four times a day (QID) | ORAL | 0 refills | Status: AC

## 2023-12-05 MED ORDER — SODIUM CHLORIDE 0.9 % IV BOLUS
1000.0000 mL | Freq: Once | INTRAVENOUS | Status: AC
  Administered 2023-12-05: 1000 mL via INTRAVENOUS

## 2023-12-05 MED ORDER — DROPERIDOL 2.5 MG/ML IJ SOLN
1.2500 mg | Freq: Once | INTRAMUSCULAR | Status: AC
  Administered 2023-12-05: 1.25 mg via INTRAVENOUS
  Filled 2023-12-05: qty 2

## 2023-12-05 MED ORDER — ONDANSETRON 4 MG PO TBDP
4.0000 mg | ORAL_TABLET | Freq: Once | ORAL | Status: AC
  Administered 2023-12-05: 4 mg via ORAL
  Filled 2023-12-05: qty 1

## 2023-12-05 MED ORDER — METOCLOPRAMIDE HCL 5 MG/ML IJ SOLN
10.0000 mg | Freq: Once | INTRAMUSCULAR | Status: AC
  Administered 2023-12-05: 10 mg via INTRAVENOUS
  Filled 2023-12-05: qty 2

## 2023-12-05 NOTE — ED Provider Notes (Addendum)
Accepted handoff at shift change from [facility], [place]. Please see prior provider note for more detail.   Briefly: Patient is 38 y.o. presenting for 2 days of nausea and vomiting and generalized abdominal pain.  Was seen at [facility] earlier.  Was found to have the flu.  Does endorse recent marijuana use.  DDX: concern for viral illness, gastroenteritis, intra-abdominal etiology, CHS, others   Plan: Reassess after Reglan.  P.o. challenge.  Physical Exam  BP (!) 137/95 (BP Location: Right Arm)   Pulse (!) 108   Temp 97.7 F (36.5 C)   Resp 20   Ht 6\' 1"  (1.854 m)   Wt 90.7 kg   SpO2 95%   BMI 26.39 kg/m   Physical Exam  Procedures  Procedures  ED Course / MDM   Clinical Course as of 12/05/23 0659  Fri Dec 05, 2023  9147 Has flu. Still  vomiting. Just received reglan. Need to PO challenge. Eloped from [facility]. Labs done there. If reglan fails, may try droperidol.  [JR]    Clinical Course User Index [JR] Gareth Eagle, PA-C   Medical Decision Making Risk Prescription drug management.   On reassessment, still vomiting.  Per my exam, abdomen was soft, nontender and patient was well-appearing in no acute distress.  Ordered EKG, droperidol and IV fluids.  After treatment, p.o. challenged without complication and patient stated that he is feeling much better.  Advised him to follow-up with PCP.  Discussed return precautions.  Vital stable.  Discharged in good condition.       Gareth Eagle, PA-C 12/05/23 0909    Gareth Eagle, PA-C 12/05/23 8295    Cathren Laine, MD 12/05/23 1017

## 2023-12-05 NOTE — Discharge Instructions (Addendum)
Evaluation today was overall reassuring.  Recommend that he continue assertive hydration at home.  Also sent Zofran to pharmacy to help with nausea and vomiting.  Symptoms could be related to marijuana use.  Recommend you follow-up with your PCP.  If you have worsening abdominal pain, develop a fever, persistent nausea vomiting please return emergency department further evaluation.

## 2023-12-05 NOTE — ED Triage Notes (Signed)
  Patient comes in with 2 day hx of N/V.  Patient was seen earlier at [facility] and had blood/RVP done.  Patient tested positive for flu A.  No fever at home that he is aware of.  Pain 8/10, cramping.

## 2023-12-05 NOTE — ED Provider Notes (Signed)
[place] EMERGENCY DEPARTMENT AT [facility] Provider Note   CSN: [id number] Arrival date & time: 12/05/23  0440     History  Chief Complaint  Patient presents with   Emesis   Nausea    Bruce Shelton is a 38 y.o. male.  Patient presents to the emergency department feeling of 2 days of nausea and vomiting.  Patient was [facility] and had labs done but eloped from the department prior to being seen due to wait times.  He denies chest pain, shortness of breath, fever, urinary symptoms, diarrhea.     Emesis      Home Medications Prior to Admission medications   Medication Sig Start Date End Date Taking? Authorizing Provider  dicyclomine (BENTYL) 20 MG tablet Take 1 tablet (20 mg total) by mouth 2 (two) times daily. 02/28/23   Renne Crigler, PA-C  ondansetron (ZOFRAN-ODT) 4 MG disintegrating tablet Take 1 tablet (4 mg total) by mouth every 8 (eight) hours as needed for nausea or vomiting. 02/28/23   Renne Crigler, PA-C  rosuvastatin (CRESTOR) 20 MG tablet Take 20 mg by mouth at bedtime. 11/28/21   [provider]  prochlorperazine (COMPAZINE) 10 MG tablet Take 1 tablet (10 mg total) by mouth 2 (two) times daily as needed for nausea or vomiting. 02/08/19 08/13/19  Benjiman Core, MD      Allergies    Patient has no known allergies.    Review of Systems   Review of Systems  Gastrointestinal:  Positive for vomiting.    Physical Exam Updated Vital Signs BP (!) 137/95 (BP Location: Right Arm)   Pulse (!) 108   Temp 97.7 F (36.5 C)   Resp 20   Ht 6\' 1"  (1.854 m)   Wt 90.7 kg   SpO2 95%   BMI 26.39 kg/m  Physical Exam Vitals and nursing note reviewed.  Constitutional:      General: He is not in acute distress.    Appearance: He is well-developed.     Comments: Patient resting in bed, utilizing mobile phone, no acute distress  HENT:     Head: Normocephalic and atraumatic.  Eyes:     Conjunctiva/sclera: Conjunctivae  normal.  Cardiovascular:     Rate and Rhythm: Normal rate and regular rhythm.  Pulmonary:     Effort: Pulmonary effort is normal. No respiratory distress.     Breath sounds: Normal breath sounds.  Abdominal:     Palpations: Abdomen is soft.     Tenderness: There is no abdominal tenderness.  Musculoskeletal:        General: No swelling.     Cervical back: Neck supple.  Skin:    General: Skin is warm and dry.     Capillary Refill: Capillary refill takes less than 2 seconds.  Neurological:     Mental Status: He is alert.  Psychiatric:        Mood and Affect: Mood normal.     ED Results / Procedures / Treatments   Labs (all labs ordered are listed, but only abnormal results are displayed) Labs Reviewed - No data to display  EKG None  Radiology No results found.  Procedures Procedures    Medications Ordered in ED Medications  ondansetron (ZOFRAN-ODT) disintegrating tablet 4 mg (4 mg Oral Given 12/05/23 0503)    ED Course/ Medical Decision Making/ A&P  Medical Decision Making Risk Prescription drug management.   This patient presents to the ED for concern of nausea and vomiting, this involves an extensive number of treatment options, and is a complaint that carries with it a high risk of complications and morbidity.  The differential diagnosis includes viral illness, gastroenteritis, intra-abdominal etiology, others   Co morbidities that complicate the patient evaluation  History of intractable vomiting, cannabis abuse   Additional history obtained:   External records from outside source obtained and reviewed including lab work from earlier visit   Lab Tests:  I reviewed labs drawn earlier at [facility].  The pertinent results include: Positive for influenza A, lipase less than 10, negative strep test   Imaging Studies ordered:  No abdominal tenderness.  No sign of acute/surgical abdomen.  No imaging ordered   Problem  List / ED Course / Critical interventions / Medication management   I ordered medication including Zofran and Reglan for nausea and vomiting Reevaluation of the patient after these medicines showed that the patient  did not improve after Zofran I have reviewed the patients home medicines and have made adjustments as needed   Social Determinants of Health:  Not a significant factor during this encounter   Test / Admission - Considered:  Patient care being transferred to Riki Sheer, PA-C.  Disposition pending reassessment after further antiemetics.  If patient continues to fail p.o. challenge patient will need admission for IV fluids.        Final Clinical Impression(s) / ED Diagnoses Final diagnoses:  None    Rx / DC Orders ED Discharge Orders     None         Pamala Duffel 12/05/23 8413    Sloan Leiter, DO 12/05/23 [id number]

## 2023-12-05 NOTE — ED Notes (Signed)
Pt given gingerale- tolerating well at this time

## 2023-12-11 ENCOUNTER — Ambulatory Visit: Payer: BC Managed Care – PPO | Admitting: Podiatry

## 2024-03-27 IMAGING — DX DG CHEST 1V PORT
1 series · 1 of 1 positions shown · non-contrast
Comparison: Chest x-ray January 02, 2022

CLINICAL DATA: Cough.

EXAM:
PORTABLE CHEST 1 VIEW

[chest ap]
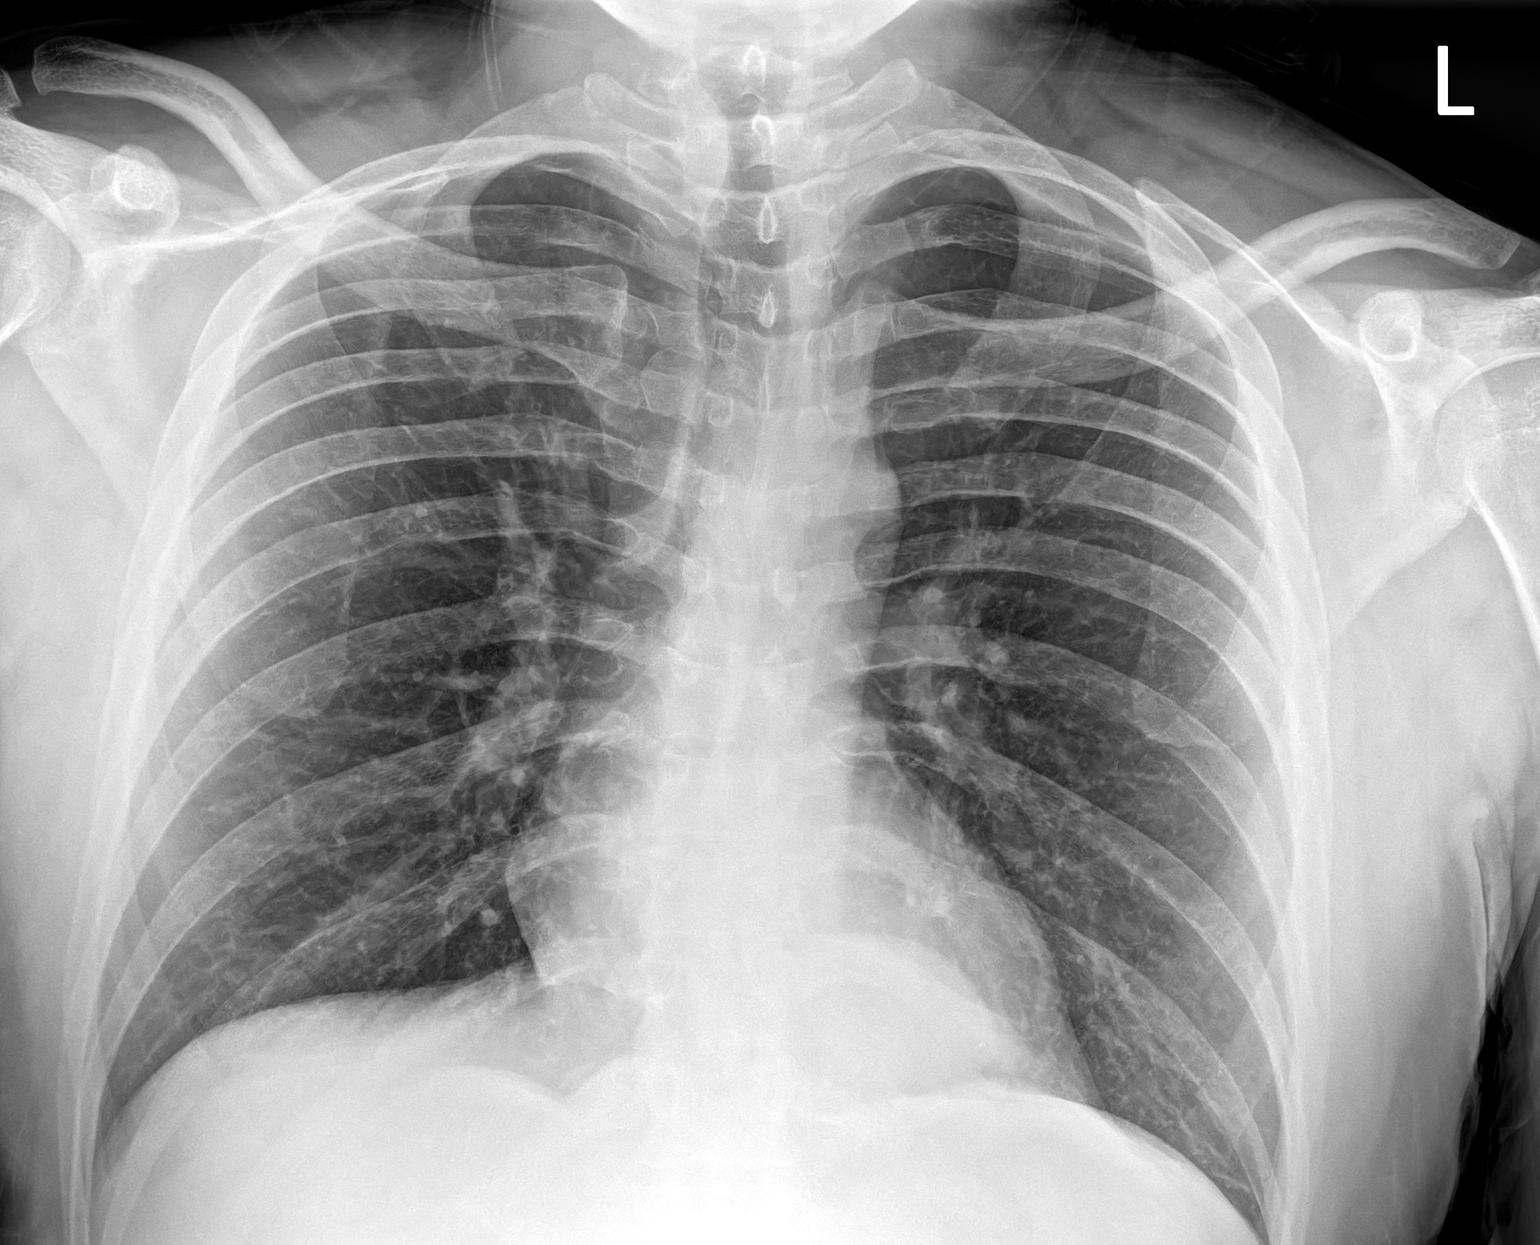

[1 of 1 positions shown; findings below may reference images not displayed]

FINDINGS: Left cardiophrenic opacity correlates with Bochdalek's type hernia
on prior CT abdomen/pelvis. No consolidation. No visible pleural
effusions or pneumothorax. Cardiomediastinal silhouette is within
normal limits. No displaced fracture.
IMPRESSION: 1. No evidence of acute cardiopulmonary disease.
2. Left cardiophrenic opacity correlates with Bochdalek's type
hernia on prior CT abdomen/pelvis.

## 2024-04-18 ENCOUNTER — Encounter (HOSPITAL_BASED_OUTPATIENT_CLINIC_OR_DEPARTMENT_OTHER): Payer: Self-pay | Admitting: Emergency Medicine

## 2024-04-18 ENCOUNTER — Other Ambulatory Visit: Payer: Self-pay

## 2024-04-18 ENCOUNTER — Emergency Department (HOSPITAL_BASED_OUTPATIENT_CLINIC_OR_DEPARTMENT_OTHER)
Admission: EM | Admit: 2024-04-18 | Discharge: 2024-04-18 | Disposition: A | Discharge status: Home / Self Care | Attending: Emergency Medicine | Admitting: Emergency Medicine

## 2024-04-18 DIAGNOSIS — M79662 Pain in left lower leg: Secondary | ICD-10-CM | POA: Diagnosis present

## 2024-04-18 DIAGNOSIS — X501XXA Overexertion from prolonged static or awkward postures, initial encounter: Secondary | ICD-10-CM | POA: Diagnosis not present

## 2024-04-18 DIAGNOSIS — Y9367 Activity, basketball: Secondary | ICD-10-CM | POA: Insufficient documentation

## 2024-04-18 LAB — D-DIMER, QUANTITATIVE: D-Dimer, Quant: 0.55 ug{FEU}/mL — ABNORMAL HIGH (ref 0.00–0.50)

## 2024-04-18 MED ORDER — IBUPROFEN 800 MG PO TABS: 800.0000 mg | ORAL_TABLET | Freq: Three times a day (TID) | ORAL | 0 refills | Status: DC

## 2024-04-18 MED ORDER — IBUPROFEN 800 MG PO TABS: 800.0000 mg | ORAL_TABLET | Freq: Three times a day (TID) | ORAL | 0 refills | Status: AC | PRN

## 2024-04-18 MED ORDER — IBUPROFEN 800 MG PO TABS
800.0000 mg | ORAL_TABLET | Freq: Once | ORAL | Status: AC
  Administered 2024-04-18: 800 mg via ORAL
  Filled 2024-04-18: qty 1

## 2024-04-18 NOTE — ED Triage Notes (Signed)
 Injured left leg playing basketball last Tuesday. Comes in for continued swelling and pain in left calf. Seen at [facility] sent for imaging.

## 2024-04-18 NOTE — Discharge Instructions (Addendum)
 Your blood test to look for signs of a blood clot was abnormal, it is possible you have a blood clot in your left leg.  Unfortunately, we do not have ultrasound available right now.  Please return to our imaging center here at [facility] at 7 AM for your ultrasound to evaluate for possible blood clot in your left leg.  You likely strained versus pulled a muscle in your calf which is causing your pain.  Please use your crutches to help you ambulate and take weight off of your leg.  You may use up to 600mg  ibuprofen  every 6 hours as needed for pain.  Do not exceed 2.4g of ibuprofen  per day.  You may take up to 1000mg  of tylenol  every 6 hours as needed for pain.  Do not take more then 4g per day.  Please follow-up within the next week with orthopedic Dr. Orysia Blas office listed below for further management of your injury.  Return immediately to the emergency room if you have severe uncontrolled pain in your left calf, numbness in your foot, chest pain or shortness of breath, any other new concerning symptoms

## 2024-04-18 NOTE — ED Provider Notes (Signed)
 [place] EMERGENCY DEPARTMENT AT [facility] Provider Note   CSN: [id number] Arrival date & time: 04/18/24  2001     History  Chief Complaint  Patient presents with   Leg Injury    Bruce Shelton is a 38 y.o. male with a significant past history presents with concern for left calf pain that started after playing basketball 6 days ago.  States he went to push off of the leg and he felt a pop and immediate pain in his left calf.  Denies any numbness or tingling in the left lower extremity.  He has been able to ambulate, but with pain in the left calf.  States he went to urgent care earlier today and they recommended he come here for ultrasound to evaluate the muscle and rule out blood clot given the swelling in his left calf.  He denies any chest pain or shortness of breath.  HPI     Home Medications Prior to Admission medications   Medication Sig Start Date End Date Taking? Authorizing Provider  dicyclomine  (BENTYL ) 20 MG tablet Take 1 tablet (20 mg total) by mouth 2 (two) times daily. 02/28/23   Geiple, Joshua, PA-C  ibuprofen  (ADVIL ) 800 MG tablet Take 1 tablet (800 mg total) by mouth every 8 (eight) hours as needed for moderate pain (pain score 4-6). 04/18/24   Rexie Catena, PA-C  ondansetron  (ZOFRAN ) 4 MG tablet Take 1 tablet (4 mg total) by mouth every 6 (six) hours. 12/05/23   Robinson, John K, PA-C  ondansetron  (ZOFRAN -ODT) 4 MG disintegrating tablet Take 1 tablet (4 mg total) by mouth every 8 (eight) hours as needed for nausea or vomiting. 02/28/23   Geiple, Joshua, PA-C  rosuvastatin (CRESTOR) 20 MG tablet Take 20 mg by mouth at bedtime. 11/28/21   [provider]  prochlorperazine  (COMPAZINE ) 10 MG tablet Take 1 tablet (10 mg total) by mouth 2 (two) times daily as needed for nausea or vomiting. 02/08/19 08/13/19  Mozell Arias, MD      Allergies    Patient has no known allergies.    Review of Systems   Review of Systems  Musculoskeletal:        Left  calf pain    Physical Exam Updated Vital Signs BP (!) 130/96   Pulse 70   Temp 98 F (36.7 C) (Oral)   Resp 18   SpO2 99%  Physical Exam Vitals and nursing note reviewed.  Constitutional:      Appearance: Normal appearance.  HENT:     Head: Atraumatic.  Cardiovascular:     Rate and Rhythm: Normal rate and regular rhythm.     Comments: Pedal pulses 2+ bilaterally Pulmonary:     Effort: Pulmonary effort is normal.  Musculoskeletal:     Comments: Left lower extremity:  General No obvious deformity.  Mild diffuse edema of the posterior calf.  There is overlying ecchymoses.  Palpation Tender to palpation of the proximal calf muscle Non tender over the femur Nontender along the tibia and fibula, patella, MCL, LCL Nontender on the lateral and medial malleolus Non-tender of the popliteal fossa Nontender of the ankle diffusely.  Nontender over the first through fifth metatarsals and phalanges  ROM Full knee flexion and extension.  Able to plantarflex and dorsiflex left ankle, but with some pain   Sensation: Sensation intact throughout the lower extremity  Strength: 5/5 strength with resisted knee flexion and extension  5/5 strength with resisted ankle plantarflexion and dorsiflexion   Neurological:  General: No focal deficit present.     Mental Status: He is alert.  Psychiatric:        Mood and Affect: Mood normal.        Behavior: Behavior normal.     ED Results / Procedures / Treatments   Labs (all labs ordered are listed, but only abnormal results are displayed) Labs Reviewed  D-DIMER, QUANTITATIVE - Abnormal; Notable for the following components:      Result Value   D-Dimer, Quant 0.55 (*)    All other components within normal limits    EKG None  Radiology No results found.  Procedures Procedures    Medications Ordered in ED Medications  ibuprofen  (ADVIL ) tablet 800 mg (800 mg Oral Given 04/18/24 2252)    ED Course/ Medical Decision Making/  A&P                                 Medical Decision Making Amount and/or Complexity of Data Reviewed Labs: ordered.  Risk Prescription drug management.     Differential diagnosis includes but is not limited to muscle strain, Achilles tendon rupture, DVT, compartment syndrome  ED Course:  Upon initial evaluation, patient is well-appearing, stable vital signs aside from mildly elevated blood pressure of 130/96.  He is reporting left calf pain has been ongoing for past 6 days after pushing off of his left leg at basketball.  He is having some edema and ecchymoses of the left calf.  Tender to the proximal left calf.  His left calf is soft and he has 2+ pedal pulses bilaterally, intact sensation of the bilateral lower extremities, no numbness or tingling of the lower extremity, no concern for compartment syndrome at this time.  He is able to plantarflex and dorsiflex, not tender to palpation over the Achilles, no concern for full Achilles tear/strain at this time.  Given mechanism of injury and location of pain, most concern for other muscle strain versus full tear at this time.  Additionally, he is having some edema of the left calf, cannot rule out DVT at this time.  Will obtain D-dimer as ultrasound has unfortunately left for the night here at [facility].  Labs Ordered: I Ordered, and personally interpreted labs.  The pertinent results include:   D-dimer elevated at 0.55   Medications Given: Ibuprofen  for pain  Upon re-evaluation, patient remains well-appearing.  Discussed that his D-dimer was elevated, and he will need to return in the morning to the imaging center to obtain his ultrasound to rule out DVT.  He verbalizes understanding and states he will be able to return after dropping his son off of school.  Will also have him follow-up with orthopedics regarding his likely muscle strain versus tear.  He is able to ambulate on the left lower extremity, but with pain.  He does have his  own crutches at bedside.  Stable and appropriate for discharge home this time.  Impression: Left calf pain  Disposition:  The patient was discharged home with instructions to return in the morning to the imaging center at [facility] for his left lower extremity venous ultrasound.  It has been scheduled for patient at 7 AM per his request.  He was instructed to call orthopedics Dr. Curtiss Dowdy office for follow-up within the next week regarding his calf pain. May use home crutches to help with ambulation.  Ibuprofen  as needed for pain.  Tylenol  as needed for pain. Return precautions  given.    Record Review: External records from outside source obtained and reviewed including urgent care visit from earlier today where they recommended coming to the ER for DVT ultrasound     This chart was dictated using voice recognition software, Dragon. Despite the best efforts of this provider to proofread and correct errors, errors may still occur which can change documentation meaning.          Final Clinical Impression(s) / ED Diagnoses Final diagnoses:  Pain of left calf    Rx / DC Orders ED Discharge Orders          Ordered    US  Venous Img Lower Unilateral Left        04/18/24 2331    ibuprofen  (ADVIL ) 800 MG tablet  3 times daily,   Status:  Discontinued        04/18/24 2351    ibuprofen  (ADVIL ) 800 MG tablet  Every 8 hours PRN        04/18/24 2351              Rexie Catena, PA-C 04/18/24 2352    Hershel Los, MD 04/21/24 2034

## 2024-04-19 ENCOUNTER — Ambulatory Visit (HOSPITAL_BASED_OUTPATIENT_CLINIC_OR_DEPARTMENT_OTHER)
Admission: RE | Admit: 2024-04-19 | Discharge: 2024-04-19 | Disposition: A | Discharge status: Home / Self Care | Source: Ambulatory Visit | Attending: Emergency Medicine | Admitting: Emergency Medicine

## 2024-04-19 DIAGNOSIS — M7989 Other specified soft tissue disorders: Secondary | ICD-10-CM | POA: Insufficient documentation

## 2024-04-19 DIAGNOSIS — M79662 Pain in left lower leg: Secondary | ICD-10-CM | POA: Diagnosis present
